# Patient Record
Sex: Male | Born: 1945 | Race: Black or African American | Hispanic: No | Smoking: Never smoker
Health system: Southern US, Community
[De-identification: ages and names within clinical notes are randomized; demographics above are authoritative.]

## PROBLEM LIST (undated history)

## (undated) DIAGNOSIS — M1990 Unspecified osteoarthritis, unspecified site: Secondary | ICD-10-CM

## (undated) DIAGNOSIS — I10 Essential (primary) hypertension: Secondary | ICD-10-CM

## (undated) HISTORY — PX: NASAL POLYP SURGERY: SHX186

## (undated) HISTORY — PX: ABDOMINAL SURGERY: SHX537

## (undated) HISTORY — PX: LEG SURGERY: SHX1003

---

## 1998-02-06 ENCOUNTER — Encounter: Payer: Self-pay | Admitting: Emergency Medicine

## 1998-02-06 ENCOUNTER — Emergency Department (HOSPITAL_COMMUNITY): Admission: EM | Admit: 1998-02-06 | Discharge: 1998-02-06 | Payer: Self-pay | Admitting: Emergency Medicine

## 2003-01-11 ENCOUNTER — Ambulatory Visit (HOSPITAL_COMMUNITY): Admission: RE | Admit: 2003-01-11 | Discharge: 2003-01-11 | Payer: Self-pay | Admitting: Family Medicine

## 2003-01-11 ENCOUNTER — Encounter: Payer: Self-pay | Admitting: Family Medicine

## 2003-11-14 ENCOUNTER — Ambulatory Visit (HOSPITAL_COMMUNITY): Admission: RE | Admit: 2003-11-14 | Discharge: 2003-11-14 | Payer: Self-pay | Admitting: Family Medicine

## 2005-07-25 ENCOUNTER — Ambulatory Visit (HOSPITAL_COMMUNITY): Admission: RE | Admit: 2005-07-25 | Discharge: 2005-07-25 | Payer: Self-pay | Admitting: Internal Medicine

## 2005-08-06 ENCOUNTER — Ambulatory Visit: Payer: Self-pay | Admitting: Orthopedic Surgery

## 2005-09-03 ENCOUNTER — Ambulatory Visit: Payer: Self-pay | Admitting: Orthopedic Surgery

## 2005-09-17 ENCOUNTER — Ambulatory Visit (HOSPITAL_COMMUNITY): Admission: RE | Admit: 2005-09-17 | Discharge: 2005-09-17 | Payer: Self-pay | Admitting: Urology

## 2005-11-04 ENCOUNTER — Ambulatory Visit: Payer: Self-pay | Admitting: Internal Medicine

## 2005-11-04 ENCOUNTER — Encounter (INDEPENDENT_AMBULATORY_CARE_PROVIDER_SITE_OTHER): Payer: Self-pay | Admitting: Specialist

## 2005-11-04 ENCOUNTER — Inpatient Hospital Stay (HOSPITAL_COMMUNITY): Admission: RE | Admit: 2005-11-04 | Discharge: 2005-11-11 | Payer: Self-pay | Admitting: Urology

## 2005-11-09 ENCOUNTER — Encounter: Payer: Self-pay | Admitting: Internal Medicine

## 2005-11-26 ENCOUNTER — Ambulatory Visit: Payer: Self-pay | Admitting: Cardiology

## 2006-01-21 ENCOUNTER — Ambulatory Visit: Payer: Self-pay | Admitting: Cardiology

## 2006-06-09 ENCOUNTER — Ambulatory Visit (HOSPITAL_COMMUNITY): Admission: RE | Admit: 2006-06-09 | Discharge: 2006-06-09 | Payer: Self-pay | Admitting: Urology

## 2008-11-10 ENCOUNTER — Emergency Department (HOSPITAL_COMMUNITY): Admission: EM | Admit: 2008-11-10 | Discharge: 2008-11-10 | Payer: Self-pay | Admitting: Emergency Medicine

## 2009-03-23 ENCOUNTER — Emergency Department (HOSPITAL_COMMUNITY): Admission: EM | Admit: 2009-03-23 | Discharge: 2009-03-23 | Payer: Self-pay | Admitting: Emergency Medicine

## 2010-07-23 ENCOUNTER — Emergency Department (HOSPITAL_COMMUNITY)
Admission: EM | Admit: 2010-07-23 | Discharge: 2010-07-23 | Disposition: A | Discharge status: Home / Self Care | Payer: BC Managed Care – PPO | Attending: Emergency Medicine | Admitting: Emergency Medicine

## 2010-07-23 ENCOUNTER — Emergency Department (HOSPITAL_COMMUNITY): Payer: BC Managed Care – PPO

## 2010-07-23 DIAGNOSIS — S82853A Displaced trimalleolar fracture of unspecified lower leg, initial encounter for closed fracture: Secondary | ICD-10-CM | POA: Insufficient documentation

## 2010-07-23 DIAGNOSIS — I10 Essential (primary) hypertension: Secondary | ICD-10-CM | POA: Insufficient documentation

## 2010-07-23 DIAGNOSIS — W1789XA Other fall from one level to another, initial encounter: Secondary | ICD-10-CM | POA: Insufficient documentation

## 2010-07-23 LAB — GLUCOSE, CAPILLARY: Glucose-Capillary: 86 mg/dL (ref 70–99)

## 2010-08-07 ENCOUNTER — Emergency Department (HOSPITAL_COMMUNITY)
Admission: EM | Admit: 2010-08-07 | Discharge: 2010-08-07 | Disposition: A | Discharge status: Home / Self Care | Payer: BC Managed Care – PPO | Attending: Emergency Medicine | Admitting: Emergency Medicine

## 2010-08-07 DIAGNOSIS — R066 Hiccough: Secondary | ICD-10-CM | POA: Insufficient documentation

## 2010-08-08 NOTE — Consult Note (Signed)
NAME:  Bobby Sosa, Bobby Sosa NO.:  [id number]   MEDICAL RECORD NO.:  [id number]          PATIENT TYPE:  INP   LOCATION:  0160                         FACILITY:  [facility]   PHYSICIAN:  Bobby Sosa, MDDATE OF BIRTH:  [birth year]   DATE OF CONSULTATION:  11/07/2005  DATE OF DISCHARGE:                                   CONSULTATION   REQUESTING PHYSICIAN:  Bobby Sosa, M.D.   PRIMARY CARE PHYSICIAN:  Bobby Sosa, M.D.   REASON FOR CONSULTATION:  Throat pain and possible unstable angina.   HISTORY OF PRESENT ILLNESS:  Bobby Sosa is very pleasant 65 year old male  from [place] with a history of hypertension.  He denies any history of  known coronary artery disease.  He does not have diabetes or  hypercholesterolemia.  He tells me he had a negative screening stress test  several years ago.  Recently he developed gross hematuria and found to have  a right renal mass.  He underwent laparoscopic resection on August 15.  Pathology is consistent with a grade 3 renal cell carcinoma.  Tonight he  developed throat pain with hiccups.  There was no relief with antacid.  Also  developed mild diaphoresis.  Rapid Response was activated.  EKG showed some  high lateral T-wave inversion which was slightly worse than baseline.  He  was given one sublingual nitroglycerin with prompt relief of his symptoms.  However, the pain returned.  He started on IV nitroglycerin and now he is  pain-free.  Initial CKs were 312 with MB of 3.5 and troponin of 0.04.  On  review of systems, at baseline he is fairly active with no exertional chest  pain.  He has not had congestive heart failure.  Postoperatively he has had  some constipation secondary to an ileus.  He denies any bright red blood per  rectum or melena.  He has not had any neurological symptoms.  Remainder of  review of history is negative for HPI and problem list.   PROBLEM LIST.:  1. Renal cell carcinoma of the right kidney,  status post laparoscopic      nephrectomy on August 15.  2. Hypertension.   CURRENT MEDICATIONS:  1. IV nitroglycerin.  2. Amlodipine 10 mg a day.  3. Hydrochlorothiazide 25 mg a day.  4. Dilaudid.   ALLERGIES:  No known drug allergies.   SOCIAL HISTORY:  He lives in [place].  He is a Programmer, systems.  He  is not married.  He denies any tobacco or alcohol use.  He is a [place]  veteran.   FAMILY HISTORY:  The patient's mother died at age 52 due to complications of  stomach cancer.  The patient's father died at age 78 due to complications  secondary to a stroke.  There is no family history of premature coronary  artery disease.   PHYSICAL EXAMINATION:  GENERAL:  He is mildly sleepy but wakes up and is  appropriately oriented and able to answer all questions.  VITAL SIGNS:  He is afebrile.  Blood pressure is 130/90 with a heart rate of  90.  He is saturating in the high 90s on 4 L.  HEENT:  Sclerae anicteric.  EOMI.  There is no xanthelasma.  Mucous  membranes are moist.  NECK:  Supple.  JVP is about 6-7 cmH2O.  Carotid are 2+ bilaterally without  bruits.  There is no lymphadenopathy or thyromegaly.  CARDIAC:  He has a regular rate and rhythm with no murmurs, rubs or gallops.  LUNGS:  Clear with very minimal bibasilar crackles.  He has poor expiratory  effort.  ABDOMEN:  Soft and quiet.  It is nontender.  There is no so significant  hepatosplenomegaly.  No bruits or masses.  There is a midline surgical scar,  which is stable with appears clean.  EXTREMITIES:  Warm with no cyanosis, clubbing or edema.  Distal pulses are  2+ bilaterally.  NEUROLOGIC:  He is alert and oriented x3.  Cranial nerves II-XII are intact.  Moves all four extremities without difficulty.  Affect is appropriate though  somewhat sleepy.   Labs show a CK of 212, MB of 3.5, troponin of 0.04.  Sodium 136, potassium  3.6, chloride 103, bicarb 28, glucose of 130, BUN  of 8, creatinine of 1.4.  CBC shows  a white count of 12.3, hemoglobin of 14.0 and platelets of 239.  EKGs:  Initial EKG this evening shows sinus tachycardia at a rate of 103  with LVH.  There is some T-wave inversion in I and aVL which is slightly  increased from baseline.  Follow-up EKG shows return to baseline with just  some mild T-wave flattening in the lateral leads.  Chest x-ray shows a poor  inspiratory effort with some vascular clotting.  No frank cardiopulmonary  process.   ASSESSMENT:  1. Throat pain responsive to nitroglycerin with mild EKG changes, probable      unstable angina.  2. Postoperative day #2 from right nephrectomy secondary to renal cell      carcinoma.  3. Hypertension.   PLAN:  1. Cycle cardiac markers.  2. Treat with aspirin, nitroglycerin and beta blocker.  3. Low-dose heparin without a bolus.  Will try to keep heparin level      between 0.35 and 0.45.  This was okayed by Bobby Sosa from a      surgical standpoint.  4. Check 2-D echocardiogram.  5. If enzymes are positive or echo is abnormal, he will need a      catheterization.  If enzymes are negative, we will need decide between      inpatient Myoview versus catheterization.   We will follow.  We appreciate the consult.      Bobby Buckles. Bensimhon, MD  Electronically Signed     DRB/MEDQ  D:  11/07/2005  T:  11/07/2005  Job:  [id number]   cc:   Bobby Sosa, M.D.  Fax: [phone]

## 2010-08-08 NOTE — Discharge Summary (Signed)
NAME:  Bobby Sosa, Bobby Sosa NO.:  [id number]   MEDICAL RECORD NO.:  [id number]          PATIENT TYPE:  OUT   LOCATION:  NUC                          FACILITY:  [facility]   PHYSICIAN:  Cornelious Bryant, MD     DATE OF BIRTH:  Nov 05, 1945   DATE OF ADMISSION:  11/09/2005  DATE OF DISCHARGE:  11/11/2005                                 DISCHARGE SUMMARY   ADMISSION DIAGNOSIS:  Right renal mass.   DISCHARGE DIAGNOSIS:  Right renal mass.   PROCEDURES:  Right laparoscopic radical nephrectomy.   IN-HOSPITAL SECONDARY DIAGNOSES:  1. Stable coronary artery disease.  2. Gastroesophageal reflux disease.   HISTORY OF PRESENT ILLNESS/HOSPITAL COURSE:  [name] is a 65 year old  African American gentleman who presented to Dr. Vevelyn Royals office for  evaluation of gross hematuria.  He underwent a full hematuria workup  including CT scan that revealed an enhancing right cystic renal mass with  extension into the renal pelvis.  Metastatic evaluation was negative,  including cystoscopy.  After excessive counseling, the patient elected for a  laparoscopic radical nephrectomy.  The patient was admitted on November 05, 2005, when he successfully underwent the procedure.  __________the patient  was observed on the floor.  On postop day #2, the patient complained of  symptoms that were consistent with acid reflux.  Maalox and Protonix were  given.  However, by postop day #3, the symptoms persisted.  The patient,  otherwise, was hemodynamically stable with no evidence of bleeding.  Cardiology consultation was stopped, and the patient was treated as a  potentially unstable angina in progress pending a thorough evaluation.  The  patient was placed on beta blockers, was started on a heparin drip.  He was  given aspirin, and his cardiac markers were cycled.  On postop day #5, the  patient underwent cardiac stress test that did reveal __________ischemia.  Thorough discussion between Cardiology Service  and the patient was  undertaken.  The patient elected to continue on medical therapy and deferred  cardiac intervention in terms of cardiac catheterization.  The patient,  otherwise, remained stable for the next three days and on postop 7, the  patient was deemed stable to go home.  At discharge, the patient was  afebrile.  His vital signs were stable.  He was in no acute distress.  He  was awake, alert and oriented.  He had clear breath sounds.  He was in  regular rate and rhythm.  His abdomen was soft and nontender.  His wounds  have healed.  The patient was voiding normally with no problems.  The  patient was then discharged home on Vicodin and Colace to be taken as  needed.  He was discharged also on Toprol XL as per Cardiology  recommendations.  The patient improved.  Follow up with Dr. Laverle Patter in the  next few weeks.  He is to follow up with his Cardiologist for further  cardiac monitoring and evaluation.  Should the patient have any chest pain,  abdominal pain, fevers, chills, wound drainage or dehiscence, he is to call  us immediately  or come to the emergency room.           ______________________________  Cornelious Bryant, MD    SK/MEDQ  D:  11/11/2005  T:  11/12/2005  Job:  [id number]

## 2010-08-08 NOTE — Assessment & Plan Note (Signed)
Bobby Sosa                              CARDIOLOGY OFFICE NOTE   Bobby Sosa, Bobby Sosa                      MRN:          [id number]  DATE:11/26/2005                            DOB:          Jun 07, 1945    REFERRING PHYSICIAN:  Corrie Sosa, M.D.   REASON FOR PRESENTATION:  Patient had an abnormal Cardiolite.   HISTORY OF PRESENT ILLNESS:  The patient is a pleasant 65 year old gentleman  whom I met at [facility].  He was admitted for a resection of a  kidney and was found to have renal cell carcinoma.  He did well though he  had some chest discomfort after his surgery.  This happened on the evening  of the second day.  It was a throat discomfort and upper chest discomfort.  There were questionable ST changes on his cardiogram that were nonspecific.  Subsequently ruled out for myocardial infarction.  He did have a stress  perfusion study which suggested a possible small area of ischemia.   I carefully talked to the patient afterward.  This was a low-risk Cardiolite  scan.  He was having absolutely no symptoms prior to this.  His symptoms  quickly resolved and were thought to be GI in nature in the hospital.  He  has been a quite active gentleman.  Given all of this, we decided to assume  that there was some coronary blockage but manage him medically as he has  stable symptoms (see the CHAMPION trial).   He now returns for followup.  He said he has been doing well.  He has not  been exercising though as he is waiting to see Dr. Laverle Patter and be cleared to  become more active.  He denies any chest pressure, neck discomfort, arm  discomfort, activity-induced nausea, vomiting, excessive diaphoresis,  palpitation, presyncope or syncope.  Denies any PND or orthopnea.   PAST MEDICAL HISTORY:  1. Renal cell carcinoma status post nephrectomy.  2. Hypertension.   ALLERGIES:  NONE.   MEDICATIONS:  1. Cozaar 100 mg daily.  2. Metoprolol 100  mg daily.  3. Aspirin 81 mg daily.   REVIEW OF SYSTEMS:  As stated in the HPI, otherwise negative for other  systems.   PHYSICAL EXAMINATION:  GENERAL:  The patient is in no distress.  VITAL SIGNS:  Blood pressure 148/85, heart rate 60 and regular, weight 216  pounds.  HEENT:  Eyes unremarkable.  Pupils equal, round, react to light.  Fundi not  visualized.  Oral mucosa unremarkable.  NECK:  No jugular venous distention, 45 degrees.  Carotid upstroke brisk and  symmetric.  No bruits.  No thyromegaly.  LYMPHATICS:  No cervical, axillary, inguinal adenopathy.  LUNGS:  Clear to auscultation. bilaterally.  BACK:  No costovertebral angle tenderness.  CHEST:  Unremarkable.  HEART:  PMI not displaced or sustained.  S1 and S2 within normal limits.  No  S3, no S4.  No murmurs.  ABDOMEN:  Obese.  Positive bowel sounds.  Normal  frequency and pitch.  No  bruits, rebound, guarding.  No midline pulsatile mass.  No organomegaly.  SKIN:  No rashes.  EXTREMITIES:  2+ pulses.  No edema.  NEURO:  Oriented to person, place, time.  Cranial nerves II through XII  grossly intact.  Motor grossly intact.   EKG:  Sinus rhythm, rate 60, actually within normal limits.  Intervals  within normal limits.  No acute ST-wave change.   ASSESSMENT/PLAN:  1. Abnormal stress test.  Patient did have a mildly abnormal stress test.      We are assuming that he might have some blockage but no symptoms at      this point.  Data would suggest we could manage this medically with      excellent outcomes.  He and I have been through this discussion at      great length.  At this point he will continue the medications as      listed.  He will continue with aggressive risk reduction.  2. Hypertension.  Blood pressure is slightly elevated.  I have asked him      to keep a blood pressure diary.  He can get it checked at school or      will buy a blood pressure cuff.  I would have a low threshold to up-      titrate his  medications, possibly with the addition of a low-dose      diuretic if his pressure is not under 140/90 consistently.  Also,      therapeutic lifestyle changes with weight loss will help this.  He can      follow this up with Dr. Phillips Odor going forward.  3. Risk reduction.  Patient should have a lipid profile done once he has      recovered from his surgery.  I think assuming that he has coronary      disease I would suggest that we pursue an LDL less than 100, an HDL      greater than 40.  His LDL was only 50 in the hospital but this may have      been falsely low.  4. Followup.  I would like to see him back in 6 months or sooner if      needed.                               Rollene Rotunda, MD, [facility]    JH/MedQ  DD:  11/26/2005  DT:  11/26/2005  Job #:  [id number]   cc:   Bobby Sosa, M.D.

## 2010-08-08 NOTE — H&P (Signed)
NAME:  Bobby Sosa, Bobby Sosa NO.:  [id number]   MEDICAL RECORD NO.:  [id number]          PATIENT TYPE:  INP   LOCATION:  NA                           FACILITY:  [facility]   PHYSICIAN:  Heloise Purpura, MD      DATE OF BIRTH:  September 07, 1945   DATE OF ADMISSION:  11/04/2005  DATE OF DISCHARGE:                                HISTORY & PHYSICAL   CHIEF COMPLAINT:  Right renal mass.   HISTORY:  Bobby Sosa is a 65 year old gentleman who was evaluated recently  due to a history of painless gross hematuria, right renal mass, and an  elevated PSA.  After undergoing a complete hematuria evaluation, he was  found to have a Bosniak IV cystic right renal mass concerning for possible  malignancy.  There was no other obvious source for the patient's hematuria.  In addition, the patient underwent a prostate biopsy for his elevated PSA  which did not demonstrate malignancy.  He therefore underwent a metastatic  evaluation for his possible renal malignancy, which was negative.  This  included both a chest x-ray and routine laboratory evaluation.  After  discussing the options for management of his renal mass, the patient elected  to proceed with a laparoscopic right radical nephrectomy.   PAST MEDICAL HISTORY:  Hypertension.   PAST SURGICAL HISTORY:  None.   MEDICATIONS:  The patient is on antihypertensive therapy.   ALLERGIES:  NO KNOWN DRUG ALLERGIES.   FAMILY HISTORY:  The patient's mother died at age 95 of stomach cancer.  The patient's father died at age 17 of complications secondary to a  cerebrovascular accident.  There is also a history of hypertension in the  family.  There is no history of prostate cancer, kidney cancer or other GU  malignancy.   SOCIAL HISTORY:  The patient is a Runner, broadcasting/film/video.  He denies tobacco or alcohol  use.  He is a [place] veteran.  He is currently not married.   PHYSICAL EXAMINATION:  CONSTITUTIONAL:  The patient is a well-nourished,  well-developed,  age-appropriate male in no acute distress.  HEENT: Normocephalic, atraumatic, oropharynx is clear.  NECK:  No JVD or carotid bruits.  LYMPH NODE SURVEY:  No supraclavicular, cervical or inguinal  lymphadenopathy.  CARDIOVASCULAR:  Regular rate and rhythm without obvious murmurs.  LUNGS:  Clear bilaterally.  ABDOMEN:  Mildly protuberant without abdominal masses or bruits.  His  abdomen is nontender and he has normoactive bowel sounds.  Back:  No CVA tenderness.  GU:  Normal uncircumcised male phallus without urethral discharge and penile  lesions.  Testes are descended bilaterally and are nontender and without  masses.  No inguinal hernias are present.  DIGITAL RECTAL EXAM:  Prostate is enlarged, measuring approximately 60 g,  but without nodularity or induration or tenderness.  EXTREMITIES:  No edema.  NEUROLOGIC:  Grossly intact.   RADIOLOGIC IMAGING:  The patient's CT scan demonstrates a 5.6-cm cystic  right renal mass with an enhancing, thickened nodular component consistent  with a possible cystic renal cell carcinoma.  There is no lymphadenopathy.  There appears to be a  single right renal artery and right renal vein.   IMPRESSION:  Right renal mass worrisome for possible malignancy.   PLAN:  Laparoscopic right radical nephrectomy.  The patient will then be  admitted to the hospital for routine postoperative care.           ______________________________  Heloise Purpura, MD  Electronically Signed     LB/MEDQ  D:  11/04/2005  T:  11/04/2005  Job:  [id number]

## 2010-08-08 NOTE — Assessment & Plan Note (Signed)
[facility]                              CARDIOLOGY OFFICE NOTE   Bobby Sosa, Bobby Sosa                      MRN:          [id number]  DATE:01/21/2006                            DOB:          [birth year]    The primary is Corrie Mckusick, M.D.   REASON FOR PRESENTATION:  Evaluate patient with hypertension.   HISTORY OF PRESENT ILLNESS:  The patient is a pleasant 65 year old  gentleman.  He was sent back by Dr. Lamar Blinks nurse for evaluation of his  hypertension.  He has been apparently participating in a study and has been  getting his blood pressure checked there.  He tells me that it has been in  the 135/85 range.  He has a blood pressure cuff at home now but has not yet  started using it.  Today it is elevated as described below.  He has been on  the medications as listed.  He says he is feeling well.  He has not gotten  to exercising.  He is not, however, with his routine daily living chores  having any chest discomfort, neck discomfort, arm discomfort, activity-  induced nausea or vomiting, or excessive diaphoresis.  He is not describing  any palpitations, presyncope or syncope.  He is having no PND or orthopnea.   PAST MEDICAL HISTORY:  1. Mildly abnormal stress perfusion study, being managed medically.  2. Renal cell cancer, status post nephrectomy.  3. Hypertension.   ALLERGIES:  None.   MEDICATIONS:  1. Cozaar 100 mg a day.  2. Metoprolol 100 mg a day.  3. Aspirin 81 mg daily.   REVIEW OF SYSTEMS:  As stated in the HPI and otherwise negative for other  systems.   PHYSICAL EXAMINATION:  GENERAL:  The patient is in no distress.  VITAL SIGNS:  Weight 222 pounds (up 6 pounds), body mass index 31, blood  pressure 152/97, heart rate 71 and regular.  HEENT:  Eyelids unremarkable.  Pupils equal, round, and reactive to light,  fundi not visualized.  Oral mucosa unremarkable.  NECK:  No jugular venous distention at 45 degrees.  Normal  carotid upstroke,  brisk and symmetric, no bruits, no thyromegaly.  LYMPHATIC:  No cervical, axillary or inguinal adenopathy.  LUNGS:  Clear to auscultation bilaterally.  BACK:  No costovertebral angle tenderness.  CHEST:  Unremarkable.  HEART:  PMI not displaced or sustained, S1, S2 within normal limits, no S3,  no S4, no murmurs.  ABDOMEN:  Obese, well-healed surgical scars, positive bowel sounds, normal  in frequency and pitch, no bruits, rebound, guarding, no midline pulsatile  mass, no hepatomegaly, no splenomegaly.  SKIN:  No rashes, no nodules.  EXTREMITIES:  2+ pulses, trace lower extremity edema, no cyanosis, no  clubbing.  NEUROLOGIC:  Oriented to person, place, time.  Cranial nerves II-XII are  grossly intact, motor grossly intact.   ASSESSMENT AND PLAN:  1. Hypertension.  The patient's blood pressure is not quite at target.  He      says his readings are 135/85.  I would not add another agent if that is  the case.  Rather, he should lose weight, and we discussed this at      length.  I calculated his ideal body weight for him.  This is actually      168 pounds, though I think 185 is reasonable.  He is going to keep a      blood pressure diary three times a week.  He is going to correlate his      blood pressure cuff with the nurses.  If indeed his blood pressure is      running higher, I will probably add a low-dose diuretic.  He has asked      me to follow his blood pressure.  2. Follow-up.  I will see him again in about 3 months.  However, he will      drop off his blood pressure diary results before then.    ______________________________  Rollene Rotunda, MD, [facility]    JH/MedQ  DD: 01/21/2006  DT: 01/22/2006  Job #: [id number]   cc:   Corrie Mckusick, M.D.

## 2010-08-08 NOTE — Discharge Summary (Signed)
NAME:  Bobby, Sosa NO.:  [id number]   MEDICAL RECORD NO.:  [id number]          PATIENT TYPE:  INP   LOCATION:  1418                         FACILITY:  [facility]   PHYSICIAN:  Heloise Purpura, MD      DATE OF BIRTH:  09-01-1945   DATE OF ADMISSION:  11/04/2005  DATE OF DISCHARGE:  11/11/2005                                 DISCHARGE SUMMARY   ADMISSION DIAGNOSIS:  Right renal mass.   DISCHARGE DIAGNOSES:  1. Renal cell carcinoma.  2. Coronary artery disease.   HISTORY AND PHYSICAL:  For full details, please see admission history and  physical.  Briefly, Mr. Bobby Sosa is a 65 year old gentleman who was found to  have an incidentally detected right renal mass with enhancement on CT scan  imaging worrisome for possible renal malignancy.  He underwent a metastatic  evaluation that was negative.  After counseling, he elected to proceed with  a laparoscopic right radical nephrectomy.   HOSPITAL COURSE:  On November 04, 2005, the patient was taken to the operating  room and underwent a right laparoscopic radical nephrectomy.  He tolerated  this procedure well and without complications.  Postoperatively, he was  transferred to a regular hospital room.  He was able to begin ambulating  that evening.  On the morning of postoperative day #1, he was begun on a  clear liquid diet.  By postoperative day #2, he was doing well but did have  some abdominal distension consistent with a possible mild postoperative  ileus.  On the evening of postoperative day #2, he began complaining of  significant throat pain and discomfort.  He was initially managed with  proton pump inhibitor therapy which did not relieve his pain.  Since his  pain persisted, he was evaluated with an EKG and was administered  nitroglycerin.  Nitroglycerin did relieve his pain and he did have some  changes on his EKG worrisome for possible myocardial ischemia.  He was  therefore transferred to the step-down unit  where he was placed on telemetry  and closely monitored.  A cardiology consultation was obtained.  By  postoperative day #3, he had return of bowel function and was able to be  advanced to a regular diet.  His wound looked good without evidence of  infection throughout the remainder of his hospital course.   He did undergo further cardiac evaluation.  He was maintained on a  nitroglycerin drip initially which was eventually able to be weaned off.  He  subsequently underwent a nuclear medicine stress test which did demonstrate  some inferior myocardial ischemia.  The patient was subsequently counseled  by the cardiology team regarding options, including medical management  versus cardiac catheterization with possible intervention.  The patient  elected to proceed with medical intervention and was placed on beta blocker  therapy.  By postoperative day #7, he was stable from a cardiac standpoint  and was subsequently able to be discharged home in good condition.   DISPOSITION:  Home.   DISCHARGE MEDICATIONS:  The patient was instructed to continue his Norvasc  and aspirin.  He was told to discontinue his Lotrel on hydrochlorothiazide.  In addition, he was given a prescription for Toprol XL 100 mg p.o. daily and  Cozaar 100 mg p.o. daily per cardiology's recommendations.  He was also  given a prescription for Vicodin take as needed for pain and told to take  Colace as a stool softener.   DISCHARGE INSTRUCTIONS:  The patient was instructed to be ambulatory but  specifically refrain from any heavy lifting or strenuous activity.  He was  told to call should he develop any chest pain, shortness of breath or other  symptoms consistent with wound infection or cardiac ischemia.  The patient  was instructed to refrain from any driving and told to resume a diet as  before surgery except for changes consistent with a cardiac prudent diet.   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  These results were discussed with the  patient.   FOLLOW UP:  Mr. Bobby Sosa will follow-up with me next week for staple removal.  He also has an appointment scheduled for November 26, 2005 with Dr. Antoine Poche  in cardiology.           ______________________________  Heloise Purpura, MD  Electronically Signed     LB/MEDQ  D:  11/11/2005  T:  11/12/2005  Job:  [id number]   cc:   Rollene Rotunda, MD, [facility]  [address]  [address]  [place]  [place] [address]   Corrie Mckusick, M.D.  Fax: [phone]

## 2010-08-08 NOTE — Op Note (Signed)
NAME:  Bobby Sosa, Bobby Sosa NO.:  [id number]   MEDICAL RECORD NO.:  [id number]          PATIENT TYPE:  INP   LOCATION:  1401                         FACILITY:  [facility]   PHYSICIAN:  Heloise Purpura, MD      DATE OF BIRTH:  08/07/1945   DATE OF PROCEDURE:  11/04/2005  DATE OF DISCHARGE:                                 OPERATIVE REPORT   PREOPERATIVE DIAGNOSIS:  Right renal mass.   POSTOPERATIVE DIAGNOSIS:  Right renal mass.   PROCEDURE:  Right laparoscopic radical nephrectomy.   SURGEON:  Heloise Purpura, M.D.   ASSISTANT:  Cornelious Bryant, M.D.   ANESTHESIA:  General.   COMPLICATIONS:  None.   ESTIMATED BLOOD LOSS:  200 mL.   INTRAVENOUS FLUIDS:  2800 mL of lactated Ringer's.   SPECIMEN:  The right kidney and proximal ureter.   DRAINS:  18-French Foley catheter.   INDICATIONS:  Bobby Sosa is a 65 year old gentleman with a history of gross  hematuria.  He underwent a full hematuria evaluation and was found on CT  scan to have an enhancing right cystic renal mass measuring 5.6 cm with  extension toward the renal pelvis.  She underwent a metastatic evaluation  including chest imaging and laboratory evaluation which were negative.  The  remainder of his hematuria evaluation including cystoscopy was negative.  He  underwent a metastatic evaluation including chest imaging and a laboratory  evaluation which was negative.  Based on the size of his renal mass and the  location, a discussion was held regarding management options.  It was felt  that the patient would be best served by a radical nephrectomy.  After  discussing management options, the patient elected to proceed with this  procedure via a laparoscopic approach.  The potential risks and benefits  were discussed with the patient and he consented.   DESCRIPTION OF PROCEDURE:  The patient was taken to the operating room and a  general anesthetic was administered.  He was given preoperative antibiotics,  placed  in the right modified flank position, prepped and draped in the usual  sterile fashion.  A preoperative time out was performed.  A site was then  selected in the midline just superior to the umbilicus for placement of the  Hasson cannula.  A 1 cm vertical incision was made in the skin and carried  down through the subcutaneous tissues with electrocautery.  The rectus  fascia was then identified and lightly scored on the anterior surface.  The  peritoneal cavity was then entered under direct vision.  0 Vicryl holding  stitches were then placed.  The Hasson cannula was then inserted and secured  with the fascial holding stitches.  A pneumoperitoneum was established and  the 30 degrees lens was used to inspect the abdomen.  There was no evidence  of any intra-abdominal injuries or other abnormalities.  The remaining ports  were then placed.  A 12 mm port was placed just lateral to the rectus muscle  on the right side just inferior to the camera port site.  An additional 5 mm  port was  placed between the umbilicus and xiphoid process just to the right  of the falciform ligament.  An additional 5 mm port was placed in the far  lateral abdominal wall.  All ports were placed under direct vision without  difficulty.   With the harmonic scalpel, the white line of Toldt was incised allowing the  colon to be mobilized medially and the space between the colonic mesentery  and Gerota's fascia to be developed.  The ureter and gonadal vein were  identified and lifted anteriorly off the surface of the psoas muscle,  thereby defining the posterior plane of the kidney.  The gonadal vein was  then divided between multiple hemoclips.  Dissection continued superiorly  until the renal hilum was encountered.  There was noted to be a single renal  vein as well as a single renal artery that was just posterior to the renal  vein.  The renal artery was able to be isolated, ligated with multiple Hem-A-  Lock clips  and divided.  The renal vein was similarly isolated with the aid  of right angle dissection and then stapled and divided with the 45 mm flex  ETS stapler with a vascular staple load.  This resulted in excellent  hemostasis at the hilum.  Dissection continued superiorly.  Gerota's fascia  was entered.  Due to the lower pole location of the renal mass, it was  decided to spare the adrenal gland.  The superior aspect of the kidney was  dissected free from the adrenal gland with the aid of the harmonic scalpel.  Dissection then continued superiorly, freeing the peritoneal attachments to  the liver as well as the lateral attachments of the kidney.  The ureter was  then identified off the lower pole of the kidney and was divided between Hem-  A-Lock clips.  The remaining perinephric tissue was then incised with the  harmonic scalpel allowing the kidney specimen to be completely mobilized.   At this point, there was noted to be some light bleeding off the anterior  aspect of the inferior vena cava.  This was carefully examined and appeared  to be coming from vasovasorum just off the gonadal vein stump.  Due to the  risk of placing a clip and causing significant bleeding, it was decided to  place FloSeal and Surgicel over this area.  This resulted in excellent  hemostasis.  The renal fossa was then copiously irrigated and the  pneumoperitoneum was evacuated.  There continued to be excellent hemostasis  over the vena cava, around the renal hilum, and in the renal fossa.  The  Hasson cannula was then removed and the 15 mm EndoCatch II retrieval bag was  placed through this port site and used to retrieve the right kidney  specimen.  The periumbilical incision was then extended from the original  camera port site and the specimen was removed intact within the EndoCatch II  bag.  This fascial opening was then closed with two running 0 Vicryl sutures.  A pneumoperitoneum was then re-established and the  30 degrees lens  was used to inspect the abdomen.  Again, there appeared to be excellent  hemostasis.  A 0 Vicryl stitch had been placed through the right lateral  12mm port site with the aid of the suture passer prior to removal of the  kidney.  All remaining ports were then removed under direct vision and the  pneumoperitoneum was evacuated.  The colon had been placed back into its  normal anatomic  position.  The previously placed 0 Vicryl stitch was then  used to close the 12 mm port site.  All port sites were then injected with  0.25% Marcaine and reapproximated at the skin level with staples.  Sterile  dressings were applied.  The patient appeared to tolerate the procedure well  and without complications.  He was able to be extubated and transferred to  the recovery unit in satisfactory condition.           ______________________________  Heloise Purpura, MD  Electronically Signed     LB/MEDQ  D:  11/04/2005  T:  11/04/2005  Job:  [id number]

## 2010-08-11 ENCOUNTER — Emergency Department (HOSPITAL_COMMUNITY): Payer: BC Managed Care – PPO

## 2010-08-11 ENCOUNTER — Emergency Department (HOSPITAL_COMMUNITY)
Admission: EM | Admit: 2010-08-11 | Discharge: 2010-08-11 | Disposition: A | Discharge status: Home / Self Care | Payer: BC Managed Care – PPO | Attending: Emergency Medicine | Admitting: Emergency Medicine

## 2010-08-11 DIAGNOSIS — I10 Essential (primary) hypertension: Secondary | ICD-10-CM | POA: Insufficient documentation

## 2010-08-11 DIAGNOSIS — Z7982 Long term (current) use of aspirin: Secondary | ICD-10-CM | POA: Insufficient documentation

## 2010-08-11 DIAGNOSIS — R079 Chest pain, unspecified: Secondary | ICD-10-CM | POA: Insufficient documentation

## 2010-08-11 DIAGNOSIS — Z79899 Other long term (current) drug therapy: Secondary | ICD-10-CM | POA: Insufficient documentation

## 2010-08-11 LAB — POCT CARDIAC MARKERS: Myoglobin, poc: 217 ng/mL (ref 12–200)

## 2010-08-11 LAB — CBC
HCT: 38.2 % — ABNORMAL LOW (ref 39.0–52.0)
Hemoglobin: 13 g/dL (ref 13.0–17.0)
MCH: 30.5 pg (ref 26.0–34.0)
MCHC: 34 g/dL (ref 30.0–36.0)
MCV: 89.7 fL (ref 78.0–100.0)
Platelets: 366 10*3/uL (ref 150–400)
RDW: 13.6 % (ref 11.5–15.5)
WBC: 7.8 10*3/uL (ref 4.0–10.5)

## 2010-08-11 LAB — COMPREHENSIVE METABOLIC PANEL
ALT: 28 U/L (ref 0–53)
BUN: 13 mg/dL (ref 6–23)
CO2: 25 mEq/L (ref 19–32)
Calcium: 10 mg/dL (ref 8.4–10.5)
Creatinine, Ser: 1.04 mg/dL (ref 0.4–1.5)
Sodium: 131 mEq/L — ABNORMAL LOW (ref 135–145)

## 2010-08-11 LAB — LIPASE, BLOOD: Lipase: 20 U/L (ref 11–59)

## 2010-08-11 LAB — DIFFERENTIAL
Basophils Absolute: 0.1 10*3/uL (ref 0.0–0.1)
Eosinophils Absolute: 0.3 10*3/uL (ref 0.0–0.7)
Lymphs Abs: 1.4 10*3/uL (ref 0.7–4.0)
Monocytes Absolute: 0.8 10*3/uL (ref 0.1–1.0)
Neutrophils Relative %: 68 % (ref 43–77)

## 2010-09-25 ENCOUNTER — Ambulatory Visit (INDEPENDENT_AMBULATORY_CARE_PROVIDER_SITE_OTHER): Payer: BC Managed Care – PPO | Admitting: Internal Medicine

## 2010-11-10 ENCOUNTER — Other Ambulatory Visit (HOSPITAL_COMMUNITY): Payer: Self-pay | Admitting: Urology

## 2010-11-10 ENCOUNTER — Ambulatory Visit (HOSPITAL_COMMUNITY)
Admission: RE | Admit: 2010-11-10 | Discharge: 2010-11-10 | Disposition: A | Discharge status: Home / Self Care | Payer: BC Managed Care – PPO | Source: Ambulatory Visit | Attending: Urology | Admitting: Urology

## 2010-11-10 DIAGNOSIS — I517 Cardiomegaly: Secondary | ICD-10-CM | POA: Insufficient documentation

## 2010-11-10 DIAGNOSIS — Z85528 Personal history of other malignant neoplasm of kidney: Secondary | ICD-10-CM | POA: Insufficient documentation

## 2011-12-17 DIAGNOSIS — R972 Elevated prostate specific antigen [PSA]: Secondary | ICD-10-CM | POA: Diagnosis not present

## 2011-12-17 DIAGNOSIS — C649 Malignant neoplasm of unspecified kidney, except renal pelvis: Secondary | ICD-10-CM | POA: Diagnosis not present

## 2011-12-17 DIAGNOSIS — N423 Unspecified dysplasia of prostate: Secondary | ICD-10-CM | POA: Diagnosis not present

## 2012-01-04 DIAGNOSIS — J029 Acute pharyngitis, unspecified: Secondary | ICD-10-CM | POA: Diagnosis not present

## 2012-01-04 DIAGNOSIS — R070 Pain in throat: Secondary | ICD-10-CM | POA: Diagnosis not present

## 2012-04-23 DEATH — deceased

## 2012-08-13 DIAGNOSIS — I10 Essential (primary) hypertension: Secondary | ICD-10-CM | POA: Diagnosis not present

## 2012-08-13 DIAGNOSIS — Z6831 Body mass index (BMI) 31.0-31.9, adult: Secondary | ICD-10-CM | POA: Diagnosis not present

## 2012-08-13 DIAGNOSIS — L0390 Cellulitis, unspecified: Secondary | ICD-10-CM | POA: Diagnosis not present

## 2012-08-13 DIAGNOSIS — L0291 Cutaneous abscess, unspecified: Secondary | ICD-10-CM | POA: Diagnosis not present

## 2012-08-13 DIAGNOSIS — C649 Malignant neoplasm of unspecified kidney, except renal pelvis: Secondary | ICD-10-CM | POA: Diagnosis not present

## 2012-09-30 DIAGNOSIS — Z713 Dietary counseling and surveillance: Secondary | ICD-10-CM | POA: Diagnosis not present

## 2012-09-30 DIAGNOSIS — I10 Essential (primary) hypertension: Secondary | ICD-10-CM | POA: Diagnosis not present

## 2012-09-30 DIAGNOSIS — Z6831 Body mass index (BMI) 31.0-31.9, adult: Secondary | ICD-10-CM | POA: Diagnosis not present

## 2012-09-30 DIAGNOSIS — J019 Acute sinusitis, unspecified: Secondary | ICD-10-CM | POA: Diagnosis not present

## 2012-09-30 DIAGNOSIS — Z7182 Exercise counseling: Secondary | ICD-10-CM | POA: Diagnosis not present

## 2012-10-17 IMAGING — CR DG PELVIS 1-2V
1 series · 1 of 1 positions shown · non-contrast
Comparison: None.

CLINICAL DATA: Fall.

PELVIS - 1-2 VIEW

[view not recorded]
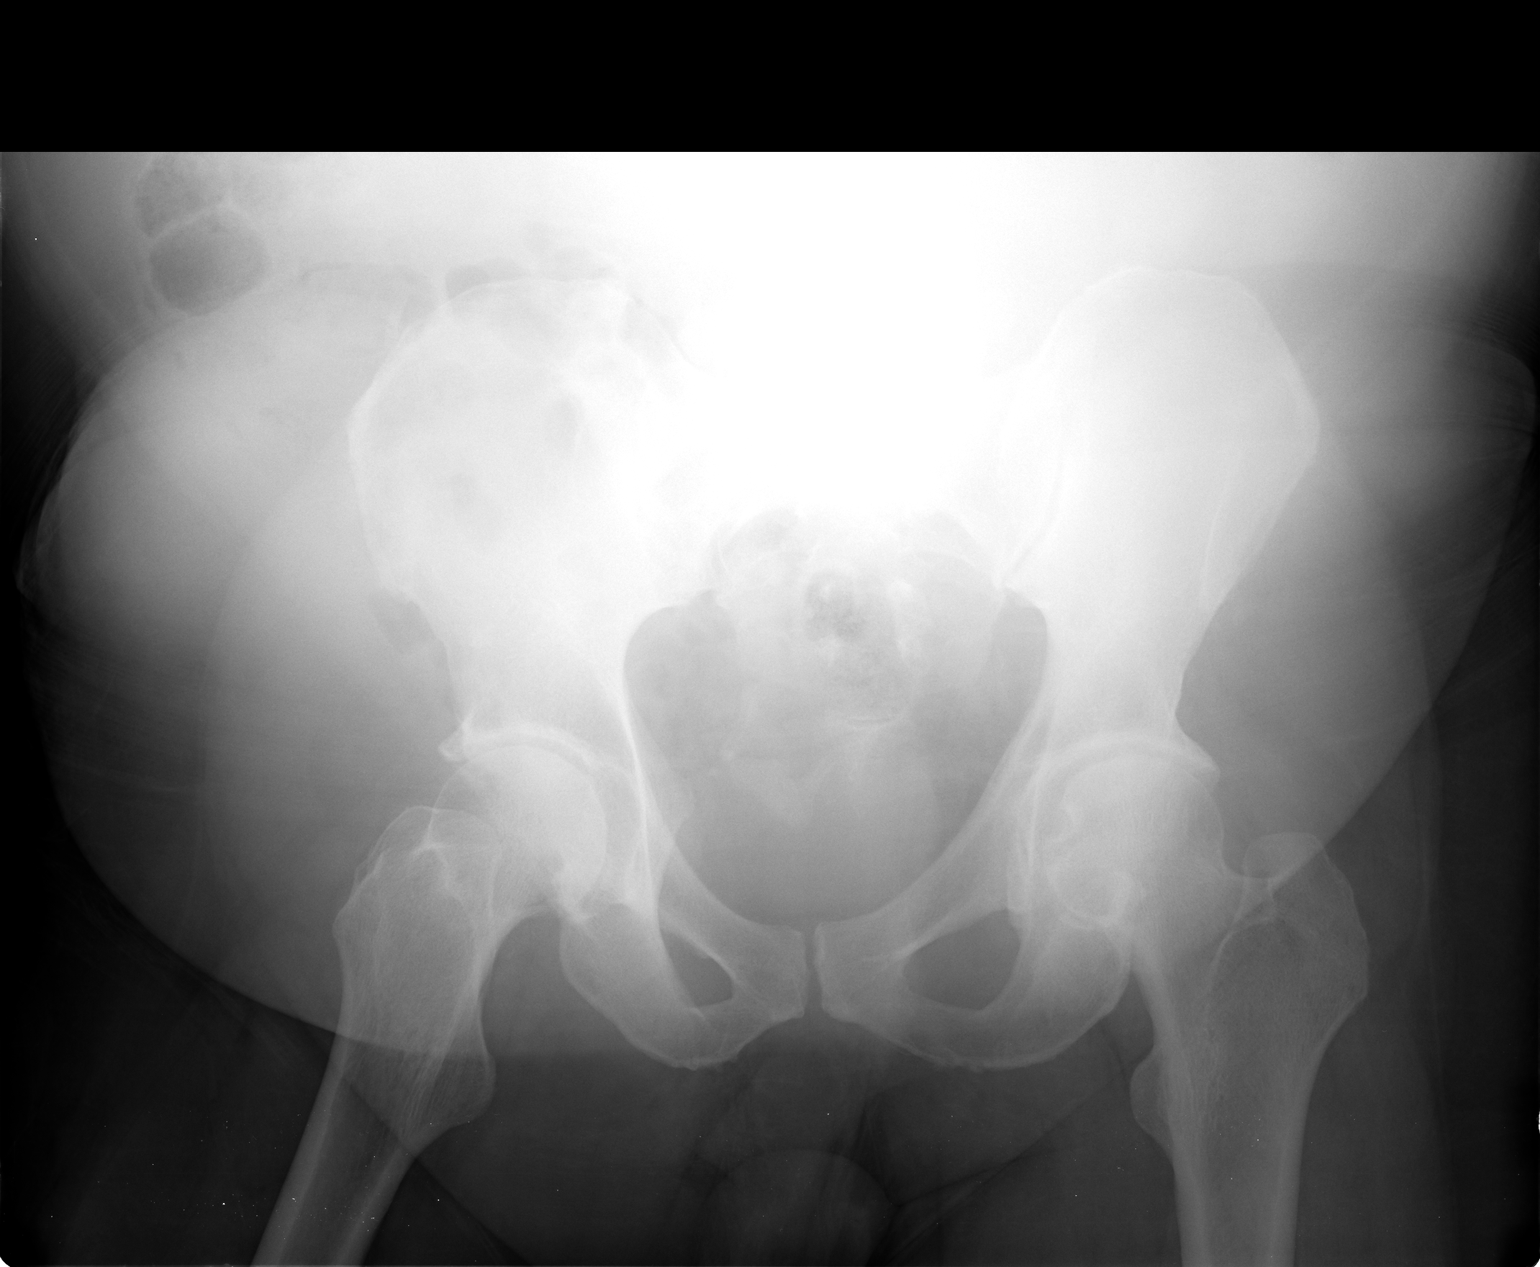

[1 of 1 positions shown; findings below may reference images not displayed]

FINDINGS: Detailed evaluation of the upper bony pelvis is somewhat
limited due to body habitus of the patient.  No evidence of
fracture.  Mild osteoarthritis of both hip joints.  Soft tissues
are unremarkable.
IMPRESSION: No evidence of pelvic fracture.

## 2012-11-11 ENCOUNTER — Encounter (HOSPITAL_COMMUNITY): Payer: Self-pay | Admitting: Emergency Medicine

## 2012-11-11 ENCOUNTER — Emergency Department (HOSPITAL_COMMUNITY): Payer: Medicare Other

## 2012-11-11 ENCOUNTER — Emergency Department (HOSPITAL_COMMUNITY)
Admission: EM | Admit: 2012-11-11 | Discharge: 2012-11-11 | Disposition: A | Discharge status: Home / Self Care | Payer: Medicare Other | Attending: Emergency Medicine | Admitting: Emergency Medicine

## 2012-11-11 DIAGNOSIS — I10 Essential (primary) hypertension: Secondary | ICD-10-CM | POA: Diagnosis not present

## 2012-11-11 DIAGNOSIS — M545 Low back pain: Secondary | ICD-10-CM

## 2012-11-11 DIAGNOSIS — IMO0002 Reserved for concepts with insufficient information to code with codable children: Secondary | ICD-10-CM | POA: Insufficient documentation

## 2012-11-11 DIAGNOSIS — Z8739 Personal history of other diseases of the musculoskeletal system and connective tissue: Secondary | ICD-10-CM | POA: Insufficient documentation

## 2012-11-11 DIAGNOSIS — S8990XA Unspecified injury of unspecified lower leg, initial encounter: Secondary | ICD-10-CM | POA: Insufficient documentation

## 2012-11-11 DIAGNOSIS — Y9389 Activity, other specified: Secondary | ICD-10-CM | POA: Insufficient documentation

## 2012-11-11 DIAGNOSIS — Y92410 Unspecified street and highway as the place of occurrence of the external cause: Secondary | ICD-10-CM | POA: Insufficient documentation

## 2012-11-11 HISTORY — DX: Unspecified osteoarthritis, unspecified site: M19.90

## 2012-11-11 HISTORY — DX: Essential (primary) hypertension: I10
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
## 2012-11-12 NOTE — ED Provider Notes (Signed)
Medical screening examination/treatment/procedure(s) were performed by non-physician practitioner and as supervising physician I was immediately available for consultation/collaboration.   Shanna Cisco, MD 11/12/12 1050

## 2012-12-09 ENCOUNTER — Other Ambulatory Visit (HOSPITAL_COMMUNITY): Payer: Self-pay | Admitting: Family Medicine

## 2012-12-09 DIAGNOSIS — Z6830 Body mass index (BMI) 30.0-30.9, adult: Secondary | ICD-10-CM | POA: Diagnosis not present

## 2012-12-09 DIAGNOSIS — R599 Enlarged lymph nodes, unspecified: Secondary | ICD-10-CM | POA: Diagnosis not present

## 2012-12-09 DIAGNOSIS — R220 Localized swelling, mass and lump, head: Secondary | ICD-10-CM | POA: Diagnosis not present

## 2012-12-09 DIAGNOSIS — Z79899 Other long term (current) drug therapy: Secondary | ICD-10-CM | POA: Diagnosis not present

## 2012-12-13 ENCOUNTER — Ambulatory Visit (HOSPITAL_COMMUNITY)
Admission: RE | Admit: 2012-12-13 | Discharge: 2012-12-13 | Disposition: A | Discharge status: Home / Self Care | Payer: Medicare Other | Source: Ambulatory Visit | Attending: Family Medicine | Admitting: Family Medicine

## 2012-12-13 DIAGNOSIS — R220 Localized swelling, mass and lump, head: Secondary | ICD-10-CM | POA: Insufficient documentation

## 2012-12-13 DIAGNOSIS — R599 Enlarged lymph nodes, unspecified: Secondary | ICD-10-CM | POA: Diagnosis not present

## 2013-02-07 DIAGNOSIS — M159 Polyosteoarthritis, unspecified: Secondary | ICD-10-CM | POA: Diagnosis not present

## 2013-02-07 DIAGNOSIS — M533 Sacrococcygeal disorders, not elsewhere classified: Secondary | ICD-10-CM | POA: Diagnosis not present

## 2013-02-07 DIAGNOSIS — Z23 Encounter for immunization: Secondary | ICD-10-CM | POA: Diagnosis not present

## 2013-02-07 DIAGNOSIS — I10 Essential (primary) hypertension: Secondary | ICD-10-CM | POA: Diagnosis not present

## 2013-02-07 DIAGNOSIS — Z6832 Body mass index (BMI) 32.0-32.9, adult: Secondary | ICD-10-CM | POA: Diagnosis not present

## 2013-10-07 DIAGNOSIS — M1990 Unspecified osteoarthritis, unspecified site: Secondary | ICD-10-CM | POA: Diagnosis not present

## 2013-10-07 DIAGNOSIS — I10 Essential (primary) hypertension: Secondary | ICD-10-CM | POA: Diagnosis not present

## 2013-10-07 DIAGNOSIS — Z6830 Body mass index (BMI) 30.0-30.9, adult: Secondary | ICD-10-CM | POA: Diagnosis not present

## 2013-10-09 DIAGNOSIS — Z6829 Body mass index (BMI) 29.0-29.9, adult: Secondary | ICD-10-CM | POA: Diagnosis not present

## 2013-10-09 DIAGNOSIS — M5430 Sciatica, unspecified side: Secondary | ICD-10-CM | POA: Diagnosis not present

## 2014-05-11 ENCOUNTER — Encounter (HOSPITAL_COMMUNITY): Payer: Self-pay | Admitting: *Deleted

## 2014-05-11 ENCOUNTER — Emergency Department (HOSPITAL_COMMUNITY): Payer: Medicare Other

## 2014-05-11 ENCOUNTER — Inpatient Hospital Stay (HOSPITAL_COMMUNITY)
Admission: EM | Admit: 2014-05-11 | Discharge: 2014-05-14 | DRG: 313 | Disposition: A | Discharge status: Home / Self Care | Payer: Medicare Other | Attending: Internal Medicine | Admitting: Internal Medicine

## 2014-05-11 DIAGNOSIS — Z905 Acquired absence of kidney: Secondary | ICD-10-CM | POA: Diagnosis not present

## 2014-05-11 DIAGNOSIS — E876 Hypokalemia: Secondary | ICD-10-CM | POA: Insufficient documentation

## 2014-05-11 DIAGNOSIS — Z85528 Personal history of other malignant neoplasm of kidney: Secondary | ICD-10-CM

## 2014-05-11 DIAGNOSIS — M1990 Unspecified osteoarthritis, unspecified site: Secondary | ICD-10-CM | POA: Diagnosis present

## 2014-05-11 DIAGNOSIS — N400 Enlarged prostate without lower urinary tract symptoms: Secondary | ICD-10-CM | POA: Diagnosis not present

## 2014-05-11 DIAGNOSIS — Z7982 Long term (current) use of aspirin: Secondary | ICD-10-CM

## 2014-05-11 DIAGNOSIS — I10 Essential (primary) hypertension: Secondary | ICD-10-CM | POA: Diagnosis not present

## 2014-05-11 DIAGNOSIS — R0602 Shortness of breath: Secondary | ICD-10-CM | POA: Diagnosis not present

## 2014-05-11 DIAGNOSIS — R079 Chest pain, unspecified: Secondary | ICD-10-CM | POA: Diagnosis not present

## 2014-05-11 DIAGNOSIS — Z09 Encounter for follow-up examination after completed treatment for conditions other than malignant neoplasm: Secondary | ICD-10-CM

## 2014-05-11 DIAGNOSIS — J449 Chronic obstructive pulmonary disease, unspecified: Secondary | ICD-10-CM | POA: Diagnosis present

## 2014-05-11 DIAGNOSIS — Z79899 Other long term (current) drug therapy: Secondary | ICD-10-CM

## 2014-05-11 DIAGNOSIS — D72829 Elevated white blood cell count, unspecified: Secondary | ICD-10-CM | POA: Diagnosis present

## 2014-05-11 DIAGNOSIS — E871 Hypo-osmolality and hyponatremia: Secondary | ICD-10-CM

## 2014-05-11 DIAGNOSIS — R071 Chest pain on breathing: Secondary | ICD-10-CM | POA: Diagnosis not present

## 2014-05-11 DIAGNOSIS — N390 Urinary tract infection, site not specified: Secondary | ICD-10-CM | POA: Diagnosis present

## 2014-05-11 DIAGNOSIS — R7989 Other specified abnormal findings of blood chemistry: Secondary | ICD-10-CM

## 2014-05-11 DIAGNOSIS — R066 Hiccough: Secondary | ICD-10-CM

## 2014-05-11 DIAGNOSIS — R0789 Other chest pain: Secondary | ICD-10-CM | POA: Diagnosis present

## 2014-05-11 DIAGNOSIS — K760 Fatty (change of) liver, not elsewhere classified: Secondary | ICD-10-CM | POA: Diagnosis not present

## 2014-05-11 DIAGNOSIS — J9811 Atelectasis: Secondary | ICD-10-CM | POA: Diagnosis not present

## 2014-05-11 DIAGNOSIS — M12812 Other specific arthropathies, not elsewhere classified, left shoulder: Secondary | ICD-10-CM | POA: Diagnosis not present

## 2014-05-11 DIAGNOSIS — R109 Unspecified abdominal pain: Secondary | ICD-10-CM

## 2014-05-11 LAB — TROPONIN I: Troponin I: 0.04 ng/mL — ABNORMAL HIGH (ref <0.031)

## 2014-05-11 LAB — COMPREHENSIVE METABOLIC PANEL
ALBUMIN: 3.4 g/dL — AB (ref 3.5–5.2)
ALT: 15 U/L (ref 0–53)
AST: 22 U/L (ref 0–37)
Alkaline Phosphatase: 62 U/L (ref 39–117)
Anion gap: 10 (ref 5–15)
BUN: 18 mg/dL (ref 6–23)
CALCIUM: 8.3 mg/dL — AB (ref 8.4–10.5)
CO2: 21 mmol/L (ref 19–32)
Chloride: 90 mmol/L — ABNORMAL LOW (ref 96–112)
Creatinine, Ser: 1.29 mg/dL (ref 0.50–1.35)
GFR calc non Af Amer: 55 mL/min — ABNORMAL LOW (ref >90)
GFR, EST AFRICAN AMERICAN: 64 mL/min — AB (ref >90)
Glucose, Bld: 112 mg/dL — ABNORMAL HIGH (ref 70–99)
Potassium: 3.4 mmol/L — ABNORMAL LOW (ref 3.5–5.1)
SODIUM: 121 mmol/L — AB (ref 135–145)
TOTAL PROTEIN: 6.9 g/dL (ref 6.0–8.3)
Total Bilirubin: 1.2 mg/dL (ref 0.3–1.2)

## 2014-05-11 LAB — URINALYSIS, ROUTINE W REFLEX MICROSCOPIC
Bilirubin Urine: NEGATIVE
GLUCOSE, UA: NEGATIVE mg/dL
Ketones, ur: NEGATIVE mg/dL
Nitrite: NEGATIVE
PH: 6 (ref 5.0–8.0)
PROTEIN: NEGATIVE mg/dL
Urobilinogen, UA: 0.2 mg/dL (ref 0.0–1.0)

## 2014-05-11 LAB — CBC WITH DIFFERENTIAL/PLATELET
Basophils Absolute: 0 10*3/uL (ref 0.0–0.1)
Basophils Relative: 0 % (ref 0–1)
EOS ABS: 0 10*3/uL (ref 0.0–0.7)
EOS PCT: 0 % (ref 0–5)
HEMATOCRIT: 40.4 % (ref 39.0–52.0)
Hemoglobin: 14.7 g/dL (ref 13.0–17.0)
LYMPHS ABS: 0.7 10*3/uL (ref 0.7–4.0)
Lymphocytes Relative: 3 % — ABNORMAL LOW (ref 12–46)
MCH: 32 pg (ref 26.0–34.0)
MCHC: 36.4 g/dL — AB (ref 30.0–36.0)
MCV: 88 fL (ref 78.0–100.0)
MONO ABS: 1.5 10*3/uL — AB (ref 0.1–1.0)
MONOS PCT: 6 % (ref 3–12)
NEUTROS ABS: 23.5 10*3/uL — AB (ref 1.7–7.7)
NEUTROS PCT: 92 % — AB (ref 43–77)
Platelets: 135 10*3/uL — ABNORMAL LOW (ref 150–400)
RBC: 4.59 MIL/uL (ref 4.22–5.81)
RDW: 13.1 % (ref 11.5–15.5)
WBC: 25.6 10*3/uL — ABNORMAL HIGH (ref 4.0–10.5)

## 2014-05-11 LAB — URINE MICROSCOPIC-ADD ON

## 2014-05-11 LAB — I-STAT CG4 LACTIC ACID, ED: LACTIC ACID, VENOUS: 1.45 mmol/L (ref 0.5–2.0)

## 2014-05-11 LAB — LIPASE, BLOOD: LIPASE: 20 U/L (ref 11–59)

## 2014-05-11 MED ORDER — SODIUM CHLORIDE 0.9 % IJ SOLN
INTRAMUSCULAR | Status: AC
  Administered 2014-05-11: 22:00:00
  Filled 2014-05-11: qty 250

## 2014-05-11 MED ORDER — SODIUM CHLORIDE 0.9 % IV SOLN
Freq: Once | INTRAVENOUS | Status: AC
  Administered 2014-05-11: 19:00:00 via INTRAVENOUS

## 2014-05-11 MED ORDER — IOHEXOL 300 MG/ML  SOLN
100.0000 mL | Freq: Once | INTRAMUSCULAR | Status: AC | PRN
Start: 2014-05-11 — End: 2014-05-11
  Administered 2014-05-11: 100 mL via INTRAVENOUS

## 2014-05-11 MED ORDER — IOHEXOL 300 MG/ML  SOLN
50.0000 mL | Freq: Once | INTRAMUSCULAR | Status: AC | PRN
  Administered 2014-05-11: 50 mL via ORAL

## 2014-05-11 NOTE — ED Notes (Signed)
Notifed EDP of pt small loose stools and lab results.

## 2014-05-11 NOTE — ED Provider Notes (Addendum)
CSN: [id number]     Arrival date & time 05/11/14  1823 History   First MD Initiated Contact with Patient 05/11/14 1836     Chief Complaint  Patient presents with  . Chest Pain     (Consider location/radiation/quality/duration/timing/severity/associated sxs/prior Treatment) HPI Comments: Patient presents to the ER for evaluation of chest pain, shortness of breath and hiccups. Patient reports that symptoms began earlier today. He has been having hiccups on and off all day. Reports the pain in his upper chest that worsens when the hiccup occurs. He has been having trouble breathing because of the hiccups well. He has noticed some abdominal discomfort. No vomiting, diarrhea, constipation.  Patient thought he had "indigestion" yesterday. He went to the [place] and was reportedly diagnosed with a hemorrhoid. He was given a suppository for the hemorrhoid and he thinks using the suppository has caused his symptoms today.  Patient is a 69 y.o. male presenting with chest pain.  Chest Pain Associated symptoms: shortness of breath     Past Medical History  Diagnosis Date  . Arthritis   . Hypertension    Past Surgical History  Procedure Laterality Date  . Leg surgery    . Abdominal surgery    . Nasal polyp surgery     Family History  Problem Relation Age of Onset  . Diabetes Mother   . Diabetes Father    History  Substance Use Topics  . Smoking status: Never Smoker   . Smokeless tobacco: Not on file  . Alcohol Use: No    Review of Systems  Respiratory: Positive for shortness of breath.   Cardiovascular: Positive for chest pain.  Gastrointestinal: Positive for abdominal distention.  All other systems reviewed and are negative.     Allergies  Review of patient's allergies indicates no known allergies.  Home Medications   Prior to Admission medications   Medication Sig Start Date End Date Taking? Authorizing Provider  amLODipine (NORVASC) 10 MG tablet Take 10 mg by mouth daily.    Yes Historical Provider, MD  ANUCORT-HC 25 MG suppository Place 25 mg rectally every 6 (six) hours as needed. 05/10/14  Yes Historical Provider, MD  aspirin EC 81 MG tablet Take 81 mg by mouth daily.   Yes Historical Provider, MD  lisinopril (PRINIVIL,ZESTRIL) 40 MG tablet Take 40 mg by mouth daily. 05/10/14  Yes Historical Provider, MD  omeprazole (PRILOSEC) 20 MG capsule Take 20 mg by mouth daily.   Yes Historical Provider, MD   BP 153/71 mmHg  Pulse 96  Temp(Src) 98.1 F (36.7 C) (Oral)  Resp 18  Ht 5\' 11"  (1.803 m)  Wt 220 lb (99.791 kg)  BMI 30.70 kg/m2  SpO2 100% Physical Exam  Constitutional: He is oriented to person, place, and time. He appears well-developed and well-nourished. No distress.  HENT:  Head: Normocephalic and atraumatic.  Right Ear: Hearing normal.  Left Ear: Hearing normal.  Nose: Nose normal.  Mouth/Throat: Oropharynx is clear and moist and mucous membranes are normal.  Eyes: Conjunctivae and EOM are normal. Pupils are equal, round, and reactive to light.  Neck: Normal range of motion. Neck supple.  Cardiovascular: Regular rhythm, S1 normal and S2 normal.  Exam reveals no gallop and no friction rub.   No murmur heard. Pulmonary/Chest: Effort normal and breath sounds normal. No respiratory distress. He exhibits no tenderness.  Abdominal: Soft. Normal appearance and bowel sounds are normal. There is no hepatosplenomegaly. There is no tenderness. There is no rebound, no guarding, no tenderness  at McBurney's point and negative Murphy's sign. No hernia.  Musculoskeletal: Normal range of motion.  Neurological: He is alert and oriented to person, place, and time. He has normal strength. No cranial nerve deficit or sensory deficit. Coordination normal. GCS eye subscore is 4. GCS verbal subscore is 5. GCS motor subscore is 6.  Skin: Skin is warm, dry and intact. No rash noted. No cyanosis.  Psychiatric: He has a normal mood and affect. His speech is normal and behavior is  normal. Thought content normal.  Nursing note and vitals reviewed.   ED Course  Procedures (including critical care time) Labs Review Labs Reviewed  CBC WITH DIFFERENTIAL/PLATELET - Abnormal; Notable for the following:    WBC 25.6 (*)    MCHC 36.4 (*)    Platelets 135 (*)    Neutrophils Relative % 92 (*)    Neutro Abs 23.5 (*)    Lymphocytes Relative 3 (*)    Monocytes Absolute 1.5 (*)    All other components within normal limits  COMPREHENSIVE METABOLIC PANEL - Abnormal; Notable for the following:    Sodium 121 (*)    Potassium 3.4 (*)    Chloride 90 (*)    Glucose, Bld 112 (*)    Calcium 8.3 (*)    Albumin 3.4 (*)    GFR calc non Af Amer 55 (*)    GFR calc Af Amer 64 (*)    All other components within normal limits  URINALYSIS, ROUTINE W REFLEX MICROSCOPIC - Abnormal; Notable for the following:    Specific Gravity, Urine <1.005 (*)    Hgb urine dipstick MODERATE (*)    Leukocytes, UA SMALL (*)    All other components within normal limits  TROPONIN I - Abnormal; Notable for the following:    Troponin I 0.04 (*)    All other components within normal limits  LIPASE, BLOOD  URINE MICROSCOPIC-ADD ON  I-STAT CG4 LACTIC ACID, ED    Imaging Review Dg Chest 2 View  05/11/2014   CLINICAL DATA:  Shortness of breath, chest pain with frequent hiccups, incontinence since noon, hypertension  EXAM: CHEST  2 VIEW  COMPARISON:  11/10/2010  FINDINGS: Enlargement of cardiac silhouette, tortuous aorta.  Mediastinal contours and pulmonary vascularity otherwise normal.  Minimal RIGHT basilar atelectasis.  Lungs otherwise clear.  No pleural effusion or pneumothorax.  LEFT glenohumeral degenerative changes.  4.0 x 3.9 cm diameter lucency with thin sclerotic rim projects over the LEFT scapula, new since 2012.  IMPRESSION: Enlargement of cardiac silhouette with minimal RIGHT basilar atelectasis.  4 cm lucency with thin sclerotic rim projects over the LEFT scapula; this could represent a lytic  process of the LEFT scapula or a faintly calcified loose body at the subcoracoid recess of the LEFT shoulder joint.  Dedicated LEFT shoulder radiographs recommended to assess.   Electronically Signed   By: Lavonia Dana M.D.   On: 05/11/2014 20:32     EKG Interpretation   Date/Time:  Friday May 11 2014 18:26:53 EST Ventricular Rate:  103 PR Interval:  202 QRS Duration: 96 QT Interval:  314 QTC Calculation: 411 R Axis:   -4 Text Interpretation:  Sinus tachycardia with frequent and consecutive  Premature ventricular complexes and Fusion complexes Left ventricular  hypertrophy with repolarization abnormality Cannot rule out Septal infarct  , age undetermined Abnormal ECG Confirmed by Harlym Gehling  MD, Derotha Fishbaugh  [phone]) on 05/11/2014 6:40:43 PM      MDM   Final diagnoses:  Shortness of breath  Abdominal  pain  Hiccups  Hyponatremia  Chest pain, unspecified chest pain type   Patient presents to the ER for evaluation of multiple problems. Patient reports onset of hiccups earlier today. They have been present for most of the day. Patient reports that he has developed abdominal pain as well as chest pain now. He also has mild shortness of breath. Abdominal exam revealed diffuse and mild tenderness, no guarding or rebound. Urinalysis unremarkable. Lactate was normal. CBC shows marked leukocytosis. Principal metabolic panel reveals significant hyponatremia, sodium 121. Remainder of CMP unremarkable.  EKG shows sinus rhythm with frequent PVCs as well as fusion complexes. Underlying rhythm and morphology, however, is unchanged from previous. He did have a very slightly elevated troponin of 0.04. He will require further workup with serial enzymes.  Patient will be admitted to the hospital for further cardiac monitoring and treatment and evaluation for hyponatremia. Based on the patient's leukocytosis and abdominal discomfort, CT abdomen has been ordered to further evaluate for intra-abdominal  process causing the discomfort and hiccups. Will be evaluated prior to admission.       Orpah Greek, MD 05/11/14 [place], MD 05/11/14 [phone]

## 2014-05-11 NOTE — ED Notes (Addendum)
Chest pain with frequent hiccups. No N/V ,alert, Given supp from [place], that pt pt thinks caused this problem.    Pt incontinent of urine at triage.  abd pain also

## 2014-05-11 NOTE — ED Notes (Signed)
Placed a condom catheter on the patient as per the nurse and placed a bed bag on the catheter.

## 2014-05-12 DIAGNOSIS — Z905 Acquired absence of kidney: Secondary | ICD-10-CM | POA: Diagnosis present

## 2014-05-12 DIAGNOSIS — R079 Chest pain, unspecified: Secondary | ICD-10-CM | POA: Insufficient documentation

## 2014-05-12 DIAGNOSIS — Z7982 Long term (current) use of aspirin: Secondary | ICD-10-CM | POA: Diagnosis not present

## 2014-05-12 DIAGNOSIS — I10 Essential (primary) hypertension: Secondary | ICD-10-CM | POA: Diagnosis present

## 2014-05-12 DIAGNOSIS — E871 Hypo-osmolality and hyponatremia: Secondary | ICD-10-CM | POA: Diagnosis present

## 2014-05-12 DIAGNOSIS — I509 Heart failure, unspecified: Secondary | ICD-10-CM | POA: Diagnosis not present

## 2014-05-12 DIAGNOSIS — Z85528 Personal history of other malignant neoplasm of kidney: Secondary | ICD-10-CM | POA: Diagnosis not present

## 2014-05-12 DIAGNOSIS — R066 Hiccough: Secondary | ICD-10-CM

## 2014-05-12 DIAGNOSIS — R791 Abnormal coagulation profile: Secondary | ICD-10-CM | POA: Diagnosis not present

## 2014-05-12 DIAGNOSIS — N390 Urinary tract infection, site not specified: Secondary | ICD-10-CM | POA: Diagnosis present

## 2014-05-12 DIAGNOSIS — D72829 Elevated white blood cell count, unspecified: Secondary | ICD-10-CM | POA: Diagnosis not present

## 2014-05-12 DIAGNOSIS — M1990 Unspecified osteoarthritis, unspecified site: Secondary | ICD-10-CM | POA: Diagnosis present

## 2014-05-12 DIAGNOSIS — E876 Hypokalemia: Secondary | ICD-10-CM | POA: Insufficient documentation

## 2014-05-12 DIAGNOSIS — R0789 Other chest pain: Secondary | ICD-10-CM | POA: Diagnosis present

## 2014-05-12 DIAGNOSIS — R0602 Shortness of breath: Secondary | ICD-10-CM | POA: Diagnosis not present

## 2014-05-12 DIAGNOSIS — R7989 Other specified abnormal findings of blood chemistry: Secondary | ICD-10-CM | POA: Diagnosis not present

## 2014-05-12 DIAGNOSIS — J449 Chronic obstructive pulmonary disease, unspecified: Secondary | ICD-10-CM | POA: Diagnosis present

## 2014-05-12 DIAGNOSIS — Z79899 Other long term (current) drug therapy: Secondary | ICD-10-CM | POA: Diagnosis not present

## 2014-05-12 LAB — CBC
HEMATOCRIT: 39.2 % (ref 39.0–52.0)
Hemoglobin: 13.9 g/dL (ref 13.0–17.0)
MCH: 31.3 pg (ref 26.0–34.0)
MCHC: 35.5 g/dL (ref 30.0–36.0)
MCV: 88.3 fL (ref 78.0–100.0)
Platelets: 146 10*3/uL — ABNORMAL LOW (ref 150–400)
RBC: 4.44 MIL/uL (ref 4.22–5.81)
RDW: 13.1 % (ref 11.5–15.5)
WBC: 21 10*3/uL — AB (ref 4.0–10.5)

## 2014-05-12 LAB — COMPREHENSIVE METABOLIC PANEL
ALBUMIN: 2.7 g/dL — AB (ref 3.5–5.2)
ALT: 15 U/L (ref 0–53)
AST: 17 U/L (ref 0–37)
Alkaline Phosphatase: 57 U/L (ref 39–117)
Anion gap: 11 (ref 5–15)
BUN: 18 mg/dL (ref 6–23)
CALCIUM: 8.3 mg/dL — AB (ref 8.4–10.5)
CO2: 21 mmol/L (ref 19–32)
CREATININE: 1.29 mg/dL (ref 0.50–1.35)
Chloride: 95 mmol/L — ABNORMAL LOW (ref 96–112)
GFR calc non Af Amer: 55 mL/min — ABNORMAL LOW (ref >90)
GFR, EST AFRICAN AMERICAN: 64 mL/min — AB (ref >90)
GLUCOSE: 90 mg/dL (ref 70–99)
Potassium: 3.3 mmol/L — ABNORMAL LOW (ref 3.5–5.1)
Sodium: 127 mmol/L — ABNORMAL LOW (ref 135–145)
TOTAL PROTEIN: 5.4 g/dL — AB (ref 6.0–8.3)
Total Bilirubin: 1.5 mg/dL — ABNORMAL HIGH (ref 0.3–1.2)

## 2014-05-12 LAB — CREATININE, SERUM
CREATININE: 1.17 mg/dL (ref 0.50–1.35)
GFR, EST AFRICAN AMERICAN: 72 mL/min — AB (ref >90)
GFR, EST NON AFRICAN AMERICAN: 62 mL/min — AB (ref >90)

## 2014-05-12 LAB — OSMOLALITY, URINE: Osmolality, Ur: 119 mOsm/kg — ABNORMAL LOW (ref 390–1090)

## 2014-05-12 LAB — TROPONIN I
Troponin I: 0.04 ng/mL — ABNORMAL HIGH (ref <0.031)
Troponin I: 0.05 ng/mL — ABNORMAL HIGH (ref <0.031)
Troponin I: 0.03 ng/mL (ref <0.031)

## 2014-05-12 LAB — SODIUM, URINE, RANDOM: Sodium, Ur: 8 mmol/L

## 2014-05-12 LAB — TSH: TSH: 1.151 u[IU]/mL (ref 0.350–4.500)

## 2014-05-12 MED ORDER — HEPARIN SODIUM (PORCINE) 5000 UNIT/ML IJ SOLN
5000.0000 [IU] | Freq: Three times a day (TID) | INTRAMUSCULAR | Status: DC
  Administered 2014-05-12 – 2014-05-14 (×7): 5000 [IU] via SUBCUTANEOUS
  Filled 2014-05-12 (×7): qty 1

## 2014-05-12 MED ORDER — POTASSIUM CHLORIDE CRYS ER 20 MEQ PO TBCR
40.0000 meq | EXTENDED_RELEASE_TABLET | Freq: Once | ORAL | Status: AC
  Administered 2014-05-12: 40 meq via ORAL
  Filled 2014-05-12: qty 2

## 2014-05-12 MED ORDER — CEFTRIAXONE SODIUM IN DEXTROSE 20 MG/ML IV SOLN
1.0000 g | INTRAVENOUS | Status: DC
  Administered 2014-05-12 – 2014-05-13 (×2): 1 g via INTRAVENOUS
  Filled 2014-05-12 (×4): qty 50

## 2014-05-12 MED ORDER — AMLODIPINE BESYLATE 5 MG PO TABS
10.0000 mg | ORAL_TABLET | Freq: Every day | ORAL | Status: DC
Start: 2014-05-12 — End: 2014-05-14
  Administered 2014-05-12 – 2014-05-14 (×3): 10 mg via ORAL
  Filled 2014-05-12 (×3): qty 2

## 2014-05-12 MED ORDER — SODIUM CHLORIDE 0.9 % IV SOLN
INTRAVENOUS | Status: DC
  Administered 2014-05-12: 07:00:00 via INTRAVENOUS

## 2014-05-12 MED ORDER — BACLOFEN 10 MG PO TABS
5.0000 mg | ORAL_TABLET | Freq: Two times a day (BID) | ORAL | Status: DC
  Administered 2014-05-12 – 2014-05-14 (×5): 5 mg via ORAL
  Filled 2014-05-12 (×5): qty 1

## 2014-05-12 MED ORDER — SODIUM CHLORIDE 0.9 % IJ SOLN
3.0000 mL | Freq: Two times a day (BID) | INTRAMUSCULAR | Status: DC
  Administered 2014-05-12 – 2014-05-14 (×4): 3 mL via INTRAVENOUS

## 2014-05-12 MED ORDER — SODIUM CHLORIDE 0.9 % IV SOLN
INTRAVENOUS | Status: DC
  Administered 2014-05-12 – 2014-05-13 (×2): via INTRAVENOUS

## 2014-05-12 MED ORDER — ACETAMINOPHEN 325 MG PO TABS
650.0000 mg | ORAL_TABLET | ORAL | Status: DC | PRN
  Administered 2014-05-12 – 2014-05-14 (×4): 650 mg via ORAL
  Filled 2014-05-12 (×4): qty 2

## 2014-05-12 MED ORDER — PANTOPRAZOLE SODIUM 40 MG PO TBEC
40.0000 mg | DELAYED_RELEASE_TABLET | Freq: Every day | ORAL | Status: DC
  Administered 2014-05-12 – 2014-05-14 (×3): 40 mg via ORAL
  Filled 2014-05-12 (×3): qty 1

## 2014-05-12 MED ORDER — ASPIRIN EC 81 MG PO TBEC
81.0000 mg | DELAYED_RELEASE_TABLET | Freq: Every day | ORAL | Status: DC
Start: 2014-05-12 — End: 2014-05-14
  Administered 2014-05-12 – 2014-05-14 (×3): 81 mg via ORAL
  Filled 2014-05-12 (×3): qty 1

## 2014-05-12 MED ORDER — LISINOPRIL 10 MG PO TABS
40.0000 mg | ORAL_TABLET | Freq: Every day | ORAL | Status: DC
  Administered 2014-05-12 – 2014-05-14 (×3): 40 mg via ORAL
  Filled 2014-05-12 (×3): qty 4

## 2014-05-12 MED ORDER — CHLORPROMAZINE HCL 100 MG PO TABS
50.0000 mg | ORAL_TABLET | Freq: Three times a day (TID) | ORAL | Status: DC | PRN
  Administered 2014-05-12 – 2014-05-13 (×2): 50 mg via ORAL
  Filled 2014-05-12 (×2): qty 1

## 2014-05-12 MED ORDER — SODIUM CHLORIDE 0.9 % IV SOLN
Freq: Once | INTRAVENOUS | Status: AC
  Administered 2014-05-12: 01:00:00 via INTRAVENOUS

## 2014-05-12 NOTE — Progress Notes (Addendum)
Patient ID: Bobby Sosa, male   DOB: 1946-02-28, 69 y.o.   MRN: [id number] [facility] PROGRESS NOTE  Bobby Sosa XNA:[id number] DOB: Sep 10, 1945 DOA: 05/11/2014 PCP: Purvis Kilts, MD  Brief narrative:    Addendum to admission note done 05/12/2014 69 y.o. male with history of RCC status post right nephrectomy, hypertension who presented to [facility] ED with complaints of hiccups which started 1 day PTA. Pt had some complaints of chest pain which per patient has resolved since admission. ED, pt was hemodynamically stable. Blood work showed WBC count of 25.6, sodium of 121, potassium of 3.4, milt troponin elevation at 0.04. The 12 lead EKG showed no acute ST T changes. CXR showed no infiltrats but there was abnormal density over the left shoulder. Left shoulder x ray did not show acute findings.   Assessment/Plan:    Principal Problem: Hiccups - unclear etiology - improved with thorazine PRN - added baclofen since pt still reports hiccups once thorazine wears of.  Active Problems: UTI / Leukocytosis  - small leukocytes seen on UA. - will start rocephin IV daily - follow up urine culture results  Chest pain / mild troponin level elevation - pt has had small elevation in troponins - no acute ischemic changes on 12 lead EKG - obtain 2 D ECHO  Hyponatremia - unclear etiology - normal urine sodium, urine osmolarity pending - TSH is WNL  Hypokalemia - unclear etiology - supplemented     DVT Prophylaxis  - Heparin subQ ordered    Code Status: Full.  Family Communication:  plan of care discussed with the patient Disposition Plan: likely home in next 24 hours if he feels better. Still shoulder pain.  IV access:  Peripheral IV  Procedures and diagnostic studies:    Dg Chest 2 View 05/11/2014   Enlargement of cardiac silhouette with minimal RIGHT basilar atelectasis.  4 cm lucency with thin sclerotic rim projects over the LEFT scapula; this could represent a lytic  process of the LEFT scapula or a faintly calcified loose body at the subcoracoid recess of the LEFT shoulder joint.  Dedicated LEFT shoulder radiographs recommended to assess.    Ct Abdomen Pelvis W Contras 05/11/2014  1. There is small left pleural effusion with left base posterior atelectasis. 2. Mild hepatic fatty infiltration. 3. Degenerative changes lumbar spine. 4. Status post right nephrectomy. No left hydronephrosis or hydroureter. 5. No pericecal inflammation. 6. Significant enlarged prostate gland with indentation of urinary bladder base. Follow-up urology exam is recommended. 7. There is mild thickening of urinary bladder wall. Chronic inflammation or cystitis cannot be excluded. Clinical correlation is necessary.     Dg Shoulder Left 05/11/2014 Abnormality noted on the current chest radiograph appears to reside anterior to the scapula and below the coracoid process. This may reflect intra-articular body. This is supported by advanced arthropathic changes of the glenohumeral joint. There are no findings suspicious for neoplastic disease to bone. No fracture or acute finding.    Medical Consultants:  None   Other Consultants:  None   IAnti-Infectives:   Rocephin 05/12/2014 -->   Leisa Lenz, MD  [facility] Pager [phone]  If 7PM-7AM, please contact night-coverage [email or web address] Password [email or web address] 05/12/2014, 12:02 PM   LOS: 0 days    HPI/Subjective: No acute overnight events.  Objective: Filed Vitals:   05/11/14 2250 05/12/14 0027 05/12/14 0620 05/12/14 1145  BP: 153/65 155/75 144/80   Pulse: 65 90    Temp: 97.8 F (36.6 C) 98.2 F (36.8 C)  100.2 F (37.9 C) 98.6 F (37 C)  TempSrc: Oral Oral Oral Axillary  Resp: 18 17    Height:      Weight:      SpO2: 100% 98% 98%     Intake/Output Summary (Last 24 hours) at 05/12/14 1202 Last data filed at 05/12/14 0858  Gross per 24 hour  Intake    123 ml  Output      0 ml  Net    123 ml    Exam:   General:  Pt  is alert, follows commands appropriately, not in acute distress  Cardiovascular: Regular rate and rhythm, S1/S2, no murmurs  Respiratory: Clear to auscultation bilaterally, no wheezing, no crackles, no rhonchi  Abdomen: Soft, non tender, non distended, bowel sounds present  Extremities: No edema, pulses DP and PT palpable bilaterally  Neuro: Grossly nonfocal  Data Reviewed: Basic Metabolic Panel:  Recent Labs Lab 05/11/14 1859 05/12/14 0033 05/12/14 0640  NA 121*  --  127*  K 3.4*  --  3.3*  CL 90*  --  95*  CO2 21  --  21  GLUCOSE 112*  --  90  BUN 18  --  18  CREATININE 1.29 1.17 1.29  CALCIUM 8.3*  --  8.3*   Liver Function Tests:  Recent Labs Lab 05/11/14 1859 05/12/14 0640  AST 22 17  ALT 15 15  ALKPHOS 62 57  BILITOT 1.2 1.5*  PROT 6.9 5.4*  ALBUMIN 3.4* 2.7*    Recent Labs Lab 05/11/14 1859  LIPASE 20   No results for input(s): AMMONIA in the last 168 hours. CBC:  Recent Labs Lab 05/11/14 1859 05/12/14 0640  WBC 25.6* 21.0*  NEUTROABS 23.5*  --   HGB 14.7 13.9  HCT 40.4 39.2  MCV 88.0 88.3  PLT 135* 146*   Cardiac Enzymes:  Recent Labs Lab 05/11/14 1859 05/12/14 0033 05/12/14 0640  TROPONINI 0.04* 0.04* 0.05*   BNP: Invalid input(s): POCBNP CBG: No results for input(s): GLUCAP in the last 168 hours.  Culture, blood (routine x 2)     Status: None (Preliminary result)   Collection Time: 05/12/14 12:33 AM  Result Value Ref Range Status   Specimen Description BLOOD RIGHT HAND  Final   Culture NO GROWTH <24 HRS  Final   Report Status PENDING  Incomplete  Culture, blood (routine x 2)     Status: None (Preliminary result)   Collection Time: 05/12/14 12:36 AM  Result Value Ref Range Status   Specimen Description BLOOD RIGHT HAND  Final   Culture NO GROWTH <24 HRS  Final   Report Status PENDING  Incomplete     Scheduled Meds: . amLODipine  10 mg Oral Daily  . aspirin EC  81 mg Oral Daily  . baclofen  5 mg Oral BID  .  cefTRIAXone (ROCEPHIN)  IV  1 g Intravenous Q24H  . heparin  5,000 Units Subcutaneous 3 times per day  . lisinopril  40 mg Oral Daily  . pantoprazole  40 mg Oral Daily  . potassium chloride  40 mEq Oral Once  . sodium chloride  3 mL Intravenous Q12H   Continuous Infusions: . sodium chloride

## 2014-05-12 NOTE — Progress Notes (Signed)
Was notified by central telemetry that patient was experiencing vtach. On the strip it showed multiple PVCs. Patient was asymptomatic and without pain. MD was notified. Patient currently stable.

## 2014-05-12 NOTE — H&P (Signed)
[facility] History and Physical  Bobby Sosa AST:[id number] DOB: 06-Jan-1946    PCP:   Purvis Kilts, MD   Chief Complaint: chest pain with hiccups.   HPI: Bobby Sosa is an 69 y.o. 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 He said he has chest and abdominal pain when he has these hiccups.  He denied SOB, nausea, vomiting, but was having soft, non diarrheal stool.  Evaluation in the ER showed Na of 121, with last value seen 4 years ago, not on diuretics, and drinking no alcohol, along with having a leukocytosis with WBC of 25K.  He denied coughs, fever, chills, dyusria, abdominal pain without hiccups, rash, HA, sorethroat or any other symptomology.  His EKG showed no acute ST T changes, and his troponin was 0.04.  His CXR showed no infiltrate, but there were abnormal density over the left shoulder, and dedicated left shoulder film done showing no acute process, no bony lesion (no osteoblastic or osteolytic lesion) Hospitalist was asked to admit him for further evaluations.   Rewiew of Systems:  Constitutional: Negative for malaise, fever and chills. No significant weight loss or weight gain Eyes: Negative for eye pain, redness and discharge, diplopia, visual changes, or flashes of light. ENMT: Negative for ear pain, hoarseness, nasal congestion, sinus pressure and sore throat. No headaches; tinnitus, drooling, or problem swallowing. Cardiovascular: Negative for chest pain, palpitations, diaphoresis, dyspnea and peripheral edema. ; No orthopnea, PND Respiratory: Negative for cough, hemoptysis, wheezing and stridor. No pleuritic chestpain. Gastrointestinal: Negative for nausea, vomiting, diarrhea, constipation, abdominal pain, melena, blood in stool, hematemesis, jaundice and rectal bleeding.    Genitourinary: Negative for frequency, dysuria,  incontinence,flank pain and hematuria; Musculoskeletal: Negative for back pain and neck pain. Negative for swelling and trauma.;  Skin: . Negative for pruritus, rash, abrasions, bruising and skin lesion.; ulcerations Neuro: Negative for headache, lightheadedness and neck stiffness. Negative for weakness, altered level of consciousness , altered mental status, extremity weakness, burning feet, involuntary movement, seizure and syncope.  Psych: negative for anxiety, depression, insomnia, tearfulness, panic attacks, hallucinations, paranoia, suicidal or homicidal ideation    Past Medical History  Diagnosis Date  . Arthritis   . Hypertension     Past Surgical History  Procedure Laterality Date  . Leg surgery    . Abdominal surgery    . Nasal polyp surgery      Medications:  HOME MEDS: Prior to Admission medications   Medication Sig Start Date End Date Taking? Authorizing Provider  amLODipine (NORVASC) 10 MG tablet Take 10 mg by mouth daily.   Yes Historical Provider, MD  ANUCORT-HC 25 MG suppository Place 25 mg rectally every 6 (six) hours as needed. 05/10/14  Yes Historical Provider, MD  aspirin EC 81 MG tablet Take 81 mg by mouth daily.   Yes Historical Provider, MD  lisinopril (PRINIVIL,ZESTRIL) 40 MG tablet Take 40 mg by mouth daily. 05/10/14  Yes Historical Provider, MD  omeprazole (PRILOSEC) 20 MG capsule Take 20 mg by mouth daily.   Yes Historical Provider, MD     Allergies:  No Known Allergies  Social History:   reports that he has never smoked. He does not have any smokeless tobacco history on file. He reports that he does not drink alcohol or use illicit drugs.  Family History: Family History  Problem Relation Age of Onset  .  Diabetes Mother   . Diabetes Father      Physical Exam: Filed Vitals:   05/11/14 1832 05/11/14 2015 05/11/14 2036 05/11/14 2250  BP: 107/67  153/71 153/65  Pulse: 98 93 96 65  Temp: 99.3 F (37.4 C)  98.1 F (36.7 C) 97.8 F (36.6 C)   TempSrc: Oral  Oral Oral  Resp: 16 18 18 18   Height: 5\' 11"  (1.803 m)     Weight: 99.791 kg (220 lb)     SpO2: 100% 98% 100% 100%   Blood pressure 153/65, pulse 65, temperature 97.8 F (36.6 C), temperature source Oral, resp. rate 18, height 5\' 11"  (1.803 m), weight 99.791 kg (220 lb), SpO2 100 %.  GEN:  Pleasant  patient lying in the stretcher in no acute distress; cooperative with exam. PSYCH:  alert and oriented x4; does not appear anxious or depressed; affect is appropriate. HEENT: Mucous membranes pink and anicteric; PERRLA; EOM intact; no cervical lymphadenopathy nor thyromegaly or carotid bruit; no JVD; There were no stridor. Neck is very supple. Breasts:: Not examined CHEST WALL: No tenderness CHEST: Normal respiration, clear to auscultation bilaterally.  HEART: Regular rate and rhythm.  There are no murmur, rub, or gallops.   BACK: No kyphosis or scoliosis; no CVA tenderness ABDOMEN: soft and non-tender; no masses, no organomegaly, normal abdominal bowel sounds; no pannus; no intertriginous candida. There is no rebound and no distention. Rectal Exam: Not done EXTREMITIES: No bone or joint deformity; age-appropriate arthropathy of the hands and knees; no edema; no ulcerations.  There is no calf tenderness. Genitalia: not examined PULSES: 2+ and symmetric SKIN: Normal hydration no rash or ulceration CNS: Cranial nerves 2-12 grossly intact no focal lateralizing neurologic deficit.  Speech is fluent; uvula elevated with phonation, facial symmetry and tongue midline. DTR are normal bilaterally, cerebella exam is intact, barbinski is negative and strengths are equaled bilaterally.  No sensory loss.   Labs on Admission:  Basic Metabolic Panel:  Recent Labs Lab 05/11/14 1859  NA 121*  K 3.4*  CL 90*  CO2 21  GLUCOSE 112*  BUN 18  CREATININE 1.29  CALCIUM 8.3*   Liver Function Tests:  Recent Labs Lab 05/11/14 1859  AST 22  ALT 15  ALKPHOS 62  BILITOT 1.2  PROT 6.9   ALBUMIN 3.4*    Recent Labs Lab 05/11/14 1859  LIPASE 20   No results for input(s): AMMONIA in the last 168 hours. CBC:  Recent Labs Lab 05/11/14 1859  WBC 25.6*  NEUTROABS 23.5*  HGB 14.7  HCT 40.4  MCV 88.0  PLT 135*   Cardiac Enzymes:  Recent Labs Lab 05/11/14 1859  TROPONINI 0.04*   Radiological Exams on Admission: Dg Chest 2 View  05/11/2014   CLINICAL DATA:  Shortness of breath, chest pain with frequent hiccups, incontinence since noon, hypertension  EXAM: CHEST  2 VIEW  COMPARISON:  11/10/2010  FINDINGS: Enlargement of cardiac silhouette, tortuous aorta.  Mediastinal contours and pulmonary vascularity otherwise normal.  Minimal RIGHT basilar atelectasis.  Lungs otherwise clear.  No pleural effusion or pneumothorax.  LEFT glenohumeral degenerative changes.  4.0 x 3.9 cm diameter lucency with thin sclerotic rim projects over the LEFT scapula, new since 2012.  IMPRESSION: Enlargement of cardiac silhouette with minimal RIGHT basilar atelectasis.  4 cm lucency with thin sclerotic rim projects over the LEFT scapula; this could represent a lytic process of the LEFT scapula or a faintly calcified loose body at the subcoracoid recess of the LEFT shoulder joint.  Dedicated LEFT shoulder radiographs recommended to assess.   Electronically Signed   By: Lavonia Dana M.D.   On: 05/11/2014 20:32   Ct Abdomen Pelvis W Contrast  05/11/2014   CLINICAL DATA:  Generalized abdominal pain, history of renal cell carcinoma  EXAM: CT ABDOMEN AND PELVIS WITH CONTRAST  TECHNIQUE: Multidetector CT imaging of the abdomen and pelvis was performed using the standard protocol following bolus administration of intravenous contrast.  CONTRAST:  139mL OMNIPAQUE IOHEXOL 300 MG/ML SOLN, 65mL OMNIPAQUE IOHEXOL 300 MG/ML SOLN  COMPARISON:  12/17/2010  FINDINGS: There is trace left pleural effusion with left base posterior atelectasis. Sagittal images of the spine shows degenerative changes thoracolumbar spine.  Significant disc space flattening at L5-S1 level. Mild posterior disc bulge at L4-L5 level. Facet degenerative changes at L4 level.  There is mild hepatic fatty infiltration. Gallbladder is contracted. No calcified gallstones are noted within gallbladder. The pancreas spleen and adrenal glands are unremarkable. The patient is status post right nephrectomy. There is normal enhancement of the left kidney. Delayed renal images shows normal excretion of the left kidney. There is no left hydronephrosis or hydroureter. Visualized proximal left ureter is unremarkable.  No small bowel obstruction. No ascites or free air. There is no pericecal inflammation. Normal appendix is partially visualized.  There is significant and large prostate gland with indentation of urinary bladder base. Prostate gland measures at least 8.3 x 8 cm. Correlation with urology exam is recommended. There is thickening of urinary bladder wall. This may be due to chronic inflammation or cystitis. Clinical correlation is necessary.  No pelvic ascites or adenopathy. No inguinal adenopathy. No destructive bony lesions are noted within pelvis. Degenerative changes bilateral SI joints.  IMPRESSION: 1. There is small left pleural effusion with left base posterior atelectasis. 2. Mild hepatic fatty infiltration. 3. Degenerative changes lumbar spine. 4. Status post right nephrectomy. No left hydronephrosis or hydroureter. 5. No pericecal inflammation. 6. Significant enlarged prostate gland with indentation of urinary bladder base. Follow-up urology exam is recommended. 7. There is mild thickening of urinary bladder wall. Chronic inflammation or cystitis cannot be excluded. Clinical correlation is necessary.   Electronically Signed   By: Lahoma Crocker M.D.   On: 05/11/2014 22:42   Dg Shoulder Left  05/11/2014   CLINICAL DATA:  Left shoulder films recommended by radiologist following abnormality shown on cxr earlier tonight. Pt denies shoulder pain or injury,  stated prior history of bone spurs in both shoulders.  EXAM: LEFT SHOULDER - 2+ VIEW  COMPARISON:  None.  FINDINGS: Bony abnormality seen on the current chest radiograph projects below the coracoid process on the current exam. This may reflect a large intra-articular body within the sub scapular recess or within the subcoracoid recess.  There are no osteoblastic or osteolytic lesions. There are no fractures.  There are advanced arthropathic changes of the glenohumeral joint with marked joint space narrowing, marginal osteophytes most prominently from the margins of the humeral head, mild subchondral sclerosis.  The [facility] joint is well maintained.  Soft tissues are otherwise unremarkable.  IMPRESSION: Abnormality noted on the current chest radiograph appears to reside anterior to the scapula and below the coracoid process. This may reflect intra-articular body. This is supported by advanced arthropathic changes of the glenohumeral joint. There are no findings suspicious for neoplastic disease to bone. No fracture or acute finding.   Electronically Signed   By: Lajean Manes M.D.   On: 05/11/2014 22:55    EKG: Independently reviewed. NSR with  no acute ST T changes.    Assessment/Plan Present on Admission:  . Chest pain, atypical . Hiccups . Hyponatremia . Leukocytosis . Atypical chest pain  PLAN:  I think his atypical chest pain is from his hiccups.  I have agreed to admit him for atypical cp r/out, so will cycle his troponins.  He will be given Thorazine for his hiccups since they have really been bothering him.  I am not sure what cause his hyponatremia, though it could be hypovolemic hyponatremia, but will get urine osmolality and Urine Na just to see if this is euvolemic hyponatremia.  Clinically, it is likely to be a chronic process.  His leukocytosis is puzzling as well, and given no definite source of infection, will do urine and blood culture, hold off on empiric antibiotics (normal lactic acid  also), and repeat CBC in the am.  For his HTN, will continue his meds.   Other plans as per orders.  Code Status: FULL Haskel Khan, MD. [facility] Pager [phone] 7pm to 7am.  05/12/2014, 12:06 AM

## 2014-05-12 NOTE — Progress Notes (Signed)
INITIAL NUTRITION ASSESSMENT  DOCUMENTATION CODES Per approved criteria  -Obesity Unspecified   INTERVENTION: -Monitor Oral intake and add snacks/supps as warranted.  NUTRITION DIAGNOSIS: Increased protein/energy needs related to stress state as evidenced by being in hospital with raised WBC.   Goal: Pt to meet >/= 90% of their estimated nutrition needs   Monitor:  Wt/Oral intake hx, oral intake, labs, precedures  Reason for Assessment: MST of 2  69 y.o. 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 He said he has chest and abdominal pain when he has these hiccups  Height: Ht Readings from Last 1 Encounters:  05/11/14 5\' 11"  (1.803 m)    Weight: Wt Readings from Last 1 Encounters:  05/11/14 220 lb (99.791 kg)    Ideal Body Weight: 172 lbs  % Ideal Body Weight: 128%  Wt Readings from Last 10 Encounters:  05/11/14 220 lb (99.791 kg)    Usual Body Weight: No wt hx  BMI:  Body mass index is 30.7 kg/(m^2).  Estimated Nutritional Needs: Kcal: 1500-1800 (15-18 kcal/kg) Protein: 86-102 (1.1-1.3 g/kg) Fluid: 1.5-1.8 liters  Skin: WDL  Diet Order: Diet Heart  EDUCATION NEEDS: -Education not appropriate at this time   Intake/Output Summary (Last 24 hours) at 05/12/14 1331 Last data filed at 05/12/14 1219  Gross per 24 hour  Intake    243 ml  Output    800 ml  Net   -557 ml  Ate 75% meal  Last BM: 2/19  Labs:   Recent Labs Lab 05/11/14 1859 05/12/14 0033 05/12/14 0640  NA 121*  --  127*  K 3.4*  --  3.3*  CL 90*  --  95*  CO2 21  --  21  BUN 18  --  18  CREATININE 1.29 1.17 1.29  CALCIUM 8.3*  --  8.3*  GLUCOSE 112*  --  90    CBG (last 3)  No results for input(s): GLUCAP in the last 72 hours.  Scheduled Meds: . amLODipine  10 mg  Oral Daily  . aspirin EC  81 mg Oral Daily  . baclofen  5 mg Oral BID  . cefTRIAXone (ROCEPHIN)  IV  1 g Intravenous Q24H  . heparin  5,000 Units Subcutaneous 3 times per day  . lisinopril  40 mg Oral Daily  . pantoprazole  40 mg Oral Daily  . sodium chloride  3 mL Intravenous Q12H    Continuous Infusions: . sodium chloride 75 mL/hr at 05/12/14 1206    Past Medical History  Diagnosis Date  . Arthritis   . Hypertension     Past Surgical History  Procedure Laterality Date  . Leg surgery    . Abdominal surgery    . Nasal polyp surgery      Burtis Junes RD, LDN Nutrition Pager: [phone] 05/12/2014 1:31 PM

## 2014-05-13 ENCOUNTER — Inpatient Hospital Stay (HOSPITAL_COMMUNITY): Payer: Medicare Other

## 2014-05-13 DIAGNOSIS — R7989 Other specified abnormal findings of blood chemistry: Secondary | ICD-10-CM

## 2014-05-13 DIAGNOSIS — I509 Heart failure, unspecified: Secondary | ICD-10-CM

## 2014-05-13 LAB — CBC WITH DIFFERENTIAL/PLATELET
BASOS PCT: 0 % (ref 0–1)
Basophils Absolute: 0 10*3/uL (ref 0.0–0.1)
EOS ABS: 0.1 10*3/uL (ref 0.0–0.7)
Eosinophils Relative: 0 % (ref 0–5)
HEMATOCRIT: 39.3 % (ref 39.0–52.0)
Hemoglobin: 13.8 g/dL (ref 13.0–17.0)
LYMPHS ABS: 0.7 10*3/uL (ref 0.7–4.0)
Lymphocytes Relative: 3 % — ABNORMAL LOW (ref 12–46)
MCH: 31.2 pg (ref 26.0–34.0)
MCHC: 35.1 g/dL (ref 30.0–36.0)
MCV: 88.9 fL (ref 78.0–100.0)
MONOS PCT: 9 % (ref 3–12)
Monocytes Absolute: 2.1 10*3/uL — ABNORMAL HIGH (ref 0.1–1.0)
Neutro Abs: 20.9 10*3/uL — ABNORMAL HIGH (ref 1.7–7.7)
Neutrophils Relative %: 88 % — ABNORMAL HIGH (ref 43–77)
Platelets: 167 10*3/uL (ref 150–400)
RBC: 4.42 MIL/uL (ref 4.22–5.81)
RDW: 13.4 % (ref 11.5–15.5)
WBC: 23.8 10*3/uL — ABNORMAL HIGH (ref 4.0–10.5)

## 2014-05-13 LAB — COMPREHENSIVE METABOLIC PANEL
ALBUMIN: 2.7 g/dL — AB (ref 3.5–5.2)
ALT: 17 U/L (ref 0–53)
ANION GAP: 7 (ref 5–15)
AST: 14 U/L (ref 0–37)
Alkaline Phosphatase: 58 U/L (ref 39–117)
BUN: 17 mg/dL (ref 6–23)
CO2: 22 mmol/L (ref 19–32)
CREATININE: 1.29 mg/dL (ref 0.50–1.35)
Calcium: 8.3 mg/dL — ABNORMAL LOW (ref 8.4–10.5)
Chloride: 104 mmol/L (ref 96–112)
GFR calc non Af Amer: 55 mL/min — ABNORMAL LOW (ref >90)
GFR, EST AFRICAN AMERICAN: 64 mL/min — AB (ref >90)
Glucose, Bld: 104 mg/dL — ABNORMAL HIGH (ref 70–99)
Potassium: 3.3 mmol/L — ABNORMAL LOW (ref 3.5–5.1)
Sodium: 133 mmol/L — ABNORMAL LOW (ref 135–145)
TOTAL PROTEIN: 6.1 g/dL (ref 6.0–8.3)
Total Bilirubin: 0.7 mg/dL (ref 0.3–1.2)

## 2014-05-13 LAB — SEDIMENTATION RATE: SED RATE: 67 mm/h — AB (ref 0–16)

## 2014-05-13 LAB — D-DIMER, QUANTITATIVE: D-Dimer, Quant: 3.58 ug/mL-FEU — ABNORMAL HIGH (ref 0.00–0.48)

## 2014-05-13 MED ORDER — TECHNETIUM TO 99M ALBUMIN AGGREGATED
6.0000 | Freq: Once | INTRAVENOUS | Status: AC | PRN
  Administered 2014-05-13: 6 via INTRAVENOUS

## 2014-05-13 MED ORDER — SODIUM CHLORIDE 0.9 % IV SOLN: INTRAVENOUS | Status: DC

## 2014-05-13 MED ORDER — POTASSIUM CHLORIDE CRYS ER 20 MEQ PO TBCR
40.0000 meq | EXTENDED_RELEASE_TABLET | Freq: Once | ORAL | Status: AC
  Administered 2014-05-13: 40 meq via ORAL
  Filled 2014-05-13: qty 2

## 2014-05-13 MED ORDER — TECHNETIUM TC 99M DIETHYLENETRIAME-PENTAACETIC ACID
40.0000 | Freq: Once | INTRAVENOUS | Status: AC | PRN
  Administered 2014-05-13: 40 via RESPIRATORY_TRACT

## 2014-05-13 MED ORDER — MORPHINE SULFATE 2 MG/ML IJ SOLN: 1.0000 mg | INTRAMUSCULAR | Status: DC | PRN

## 2014-05-13 NOTE — Progress Notes (Signed)
Pt has a temperature of 101.1 and BP 155/88. Spoke with Dr.Le in placed orders for blood work and wanted tylenol to be given at this time.Pt is resting with no complaints. I will continue to monitor.

## 2014-05-13 NOTE — Progress Notes (Signed)
Patient ID: Bobby Sosa, male   DOB: 31-Mar-1945, 69 y.o.   MRN: [id number] [facility] PROGRESS NOTE  Bobby Sosa BZJ:[id number] DOB: April 26, 1945 DOA: 05/11/2014 PCP: Purvis Kilts, MD  Brief narrative:    69 y.o. male with history of RCC status post right nephrectomy, hypertension who presented to [facility] ED with complaints of hiccups which started 1 day PTA. Pt had some complaints of chest pain which per patient has resolved since admission. ED, pt was hemodynamically stable. Blood work showed WBC count of 25.6, sodium of 121, potassium of 3.4, milt troponin elevation at 0.04. The 12 lead EKG showed no acute ST T changes. CXR showed no infiltrats but there was abnormal density over the left shoulder. Left shoulder x ray did not show acute findings.   Assessment/Plan:    Principal Problem: Hiccups - unclear etiology - improved with baclofen scheduled and thorazine PRN  Active Problems: UTI / Leukocytosis  - small leukocytes seen on UA. - continue rocephin daily - urine cultures pending  Elevated D dimer - V/Q scan normal - CXR with COPD changes, no acute findings   Chest pain / mild troponin level elevation - pt has had small elevation in troponins; repeat troponin level WNL - no acute ischemic changes seen on 12 lead EKG - 2 D ECHO is pending   Hyponatremia - unclear etiology - improved with IV fluids to 133 - TSH is WNL  Hypokalemia - unclear etiology - being supplemented     DVT Prophylaxis  - Heparin subQ ordered    Code Status: Full.  Family Communication:  plan of care discussed with the patient Disposition Plan: likely home in next 24 hours.  IV access:  Peripheral IV  Procedures and diagnostic studies:    Dg Chest 2 View 05/11/2014   Enlargement of cardiac silhouette with minimal RIGHT basilar atelectasis.  4 cm lucency with thin sclerotic rim projects over the LEFT scapula; this could represent a lytic process of the LEFT scapula or a faintly  calcified loose body at the subcoracoid recess of the LEFT shoulder joint.  Dedicated LEFT shoulder radiographs recommended to assess.    Ct Abdomen Pelvis W Contras 05/11/2014  1. There is small left pleural effusion with left base posterior atelectasis. 2. Mild hepatic fatty infiltration. 3. Degenerative changes lumbar spine. 4. Status post right nephrectomy. No left hydronephrosis or hydroureter. 5. No pericecal inflammation. 6. Significant enlarged prostate gland with indentation of urinary bladder base. Follow-up urology exam is recommended. 7. There is mild thickening of urinary bladder wall. Chronic inflammation or cystitis cannot be excluded. Clinical correlation is necessary.     Dg Shoulder Left 05/11/2014 Abnormality noted on the current chest radiograph appears to reside anterior to the scapula and below the coracoid process. This may reflect intra-articular body. This is supported by advanced arthropathic changes of the glenohumeral joint. There are no findings suspicious for neoplastic disease to bone. No fracture or acute finding.    Dg Chest 2 View 05/13/2014  COPD changes.  No acute abnormalities.   Nm Pulmonary Perf And Vent 05/13/2014  Normal perfusion lung scan.    Medical Consultants:  None   Other Consultants:  None   IAnti-Infectives:   Rocephin 05/12/2014 -->     Leisa Lenz, MD  [facility] Pager [phone]  If 7PM-7AM, please contact night-coverage [email or web address] Password [facility] 05/13/2014, 4:39 PM   LOS: 1 day    HPI/Subjective: No acute overnight events.  Objective: Filed Vitals:   05/12/14  2229 05/13/14 0534 05/13/14 0800 05/13/14 1459  BP: 124/72 155/88  136/72  Pulse: 99 101  95  Temp: 100.7 F (38.2 C) 101.1 F (38.4 C) 98.2 F (36.8 C) 98.8 F (37.1 C)  TempSrc: Oral Oral Oral Oral  Resp: 18 18  20   Height:      Weight:      SpO2: 97% 98%  99%    Intake/Output Summary (Last 24 hours) at 05/13/14 1639 Last data filed at 05/13/14  1309  Gross per 24 hour  Intake 1702.5 ml  Output   2900 ml  Net -1197.5 ml    Exam:   General:  Pt is not in distress   Cardiovascular: Regular rate and rhythm, S1/S2 (+)  Respiratory: no wheezing, no crackles, no rhonchi  Abdomen:  non distended, bowel sounds present  Extremities: No edema, pulses palpable  Neuro: Nonfocal  Data Reviewed: Basic Metabolic Panel:  Recent Labs Lab 05/11/14 1859 05/12/14 0033 05/12/14 0640 05/13/14 0644  NA 121*  --  127* 133*  K 3.4*  --  3.3* 3.3*  CL 90*  --  95* 104  CO2 21  --  21 22  GLUCOSE 112*  --  90 104*  BUN 18  --  18 17  CREATININE 1.29 1.17 1.29 1.29  CALCIUM 8.3*  --  8.3* 8.3*   Liver Function Tests:  Recent Labs Lab 05/11/14 1859 05/12/14 0640 05/13/14 0644  AST 22 17 14   ALT 15 15 17   ALKPHOS 62 57 58  BILITOT 1.2 1.5* 0.7  PROT 6.9 5.4* 6.1  ALBUMIN 3.4* 2.7* 2.7*    Recent Labs Lab 05/11/14 1859  LIPASE 20   No results for input(s): AMMONIA in the last 168 hours. CBC:  Recent Labs Lab 05/11/14 1859 05/12/14 0640 05/13/14 0644  WBC 25.6* 21.0* 23.8*  NEUTROABS 23.5*  --  20.9*  HGB 14.7 13.9 13.8  HCT 40.4 39.2 39.3  MCV 88.0 88.3 88.9  PLT 135* 146* 167   Cardiac Enzymes:  Recent Labs Lab 05/11/14 1859 05/12/14 0033 05/12/14 0640 05/12/14 1331  TROPONINI 0.04* 0.04* 0.05* 0.03   BNP: Invalid input(s): POCBNP CBG: No results for input(s): GLUCAP in the last 168 hours.  Recent Results (from the past 240 hour(s))  Culture, blood (routine x 2)     Status: None (Preliminary result)   Collection Time: 05/12/14 12:33 AM  Result Value Ref Range Status   Specimen Description BLOOD RIGHT HAND  Final   Special Requests BOTTLES DRAWN AEROBIC ONLY 5CC  Final   Culture NO GROWTH 1 DAY  Final   Report Status PENDING  Incomplete  Culture, blood (routine x 2)     Status: None (Preliminary result)   Collection Time: 05/12/14 12:36 AM  Result Value Ref Range Status   Specimen  Description BLOOD RIGHT HAND  Final   Special Requests BOTTLES DRAWN AEROBIC AND ANAEROBIC 6CC  Final   Culture NO GROWTH 1 DAY  Final   Report Status PENDING  Incomplete     Scheduled Meds: . amLODipine  10 mg Oral Daily  . aspirin EC  81 mg Oral Daily  . baclofen  5 mg Oral BID  . cefTRIAXone (ROCEPHIN)  IV  1 g Intravenous Q24H  . heparin  5,000 Units Subcutaneous 3 times per day  . lisinopril  40 mg Oral Daily  . pantoprazole  40 mg Oral Daily  . sodium chloride  3 mL Intravenous Q12H   Continuous Infusions: . sodium  chloride 10 mL/hr at 05/13/14 1547

## 2014-05-13 NOTE — Progress Notes (Signed)
  Echocardiogram 2D Echocardiogram has been performed.  Lysle Rubens 05/13/2014, 10:17 AM

## 2014-05-14 LAB — CBC
HCT: 38.9 % — ABNORMAL LOW (ref 39.0–52.0)
Hemoglobin: 13.4 g/dL (ref 13.0–17.0)
MCH: 30.9 pg (ref 26.0–34.0)
MCHC: 34.4 g/dL (ref 30.0–36.0)
MCV: 89.8 fL (ref 78.0–100.0)
PLATELETS: 193 10*3/uL (ref 150–400)
RBC: 4.33 MIL/uL (ref 4.22–5.81)
RDW: 13.8 % (ref 11.5–15.5)
WBC: 19.6 10*3/uL — AB (ref 4.0–10.5)

## 2014-05-14 LAB — BASIC METABOLIC PANEL
Anion gap: 8 (ref 5–15)
BUN: 12 mg/dL (ref 6–23)
CALCIUM: 8.4 mg/dL (ref 8.4–10.5)
CO2: 23 mmol/L (ref 19–32)
Chloride: 104 mmol/L (ref 96–112)
Creatinine, Ser: 1.11 mg/dL (ref 0.50–1.35)
GFR calc Af Amer: 77 mL/min — ABNORMAL LOW (ref >90)
GFR, EST NON AFRICAN AMERICAN: 66 mL/min — AB (ref >90)
Glucose, Bld: 101 mg/dL — ABNORMAL HIGH (ref 70–99)
Potassium: 3.4 mmol/L — ABNORMAL LOW (ref 3.5–5.1)
SODIUM: 135 mmol/L (ref 135–145)

## 2014-05-14 LAB — MAGNESIUM: Magnesium: 2.1 mg/dL (ref 1.5–2.5)

## 2014-05-14 MED ORDER — BACLOFEN 10 MG PO TABS: 5.0000 mg | ORAL_TABLET | Freq: Two times a day (BID) | ORAL | Status: DC

## 2014-05-14 MED ORDER — OXYCODONE-ACETAMINOPHEN 5-325 MG PO TABS: 1.0000 | ORAL_TABLET | ORAL | Status: AC | PRN

## 2014-05-14 MED ORDER — LEVOFLOXACIN 500 MG PO TABS: 500.0000 mg | ORAL_TABLET | Freq: Every day | ORAL | Status: DC

## 2014-05-14 MED ORDER — CHLORPROMAZINE HCL 50 MG PO TABS: 50.0000 mg | ORAL_TABLET | Freq: Three times a day (TID) | ORAL | Status: DC | PRN

## 2014-05-14 MED ORDER — ACETAMINOPHEN 325 MG PO TABS: 650.0000 mg | ORAL_TABLET | ORAL | Status: AC | PRN

## 2014-05-14 NOTE — Care Management Note (Signed)
    Page 1 of 1   05/14/2014     11:22:28 AM CARE MANAGEMENT NOTE 05/14/2014  Patient:  Bobby Sosa, Bobby Sosa   Account Number:  [id number]  Date Initiated:  05/14/2014  Documentation initiated by:  Theophilus Kinds  Subjective/Objective Assessment:   Pt admitted from home with UTI. Pt lives alone and has a son and sister who are very active in the care of the pt. Pt is independent with ADL's but does have a cane, walker, and w/c for home use.     Action/Plan:   Pt discharged home today. No CM needs noted.   Anticipated DC Date:  05/14/2014   Anticipated DC Plan:  [place]  CM consult      Choice offered to / List presented to:             Status of service:  Completed, signed off Medicare Important Message given?  YES (If response is "NO", the following Medicare IM given date fields will be blank) Date Medicare IM given:  05/14/2014 Medicare IM given by:  Theophilus Kinds Date Additional Medicare IM given:   Additional Medicare IM given by:    Discharge Disposition:  HOME/SELF CARE  Per UR Regulation:    If discussed at Long Length of Stay Meetings, dates discussed:    Comments:  05/14/14 [place], RN BSN CM

## 2014-05-14 NOTE — Discharge Summary (Signed)
Physician Discharge Summary  Bobby Sosa PYK:[id number] DOB: 07-Dec-1945 DOA: 05/11/2014  PCP: Purvis Kilts, MD  Admit date: 05/11/2014 Discharge date: 05/14/2014  Recommendations for Outpatient Follow-up:  1. Continue Levaquin for 5 days on discharge for urinary tract infection.  Discharge Diagnoses:  Active Problems:   Chest pain, atypical   Hiccups   Hyponatremia   Leukocytosis   Atypical chest pain   Pain in the chest   Hypokalemia    Discharge Condition: stable; potassium supplemented prior to discharge.  Diet recommendation: as tolerated   History of present illness:  69 y.o. male with history of RCC status post right nephrectomy, hypertension who presented to [facility] ED with complaints of hiccups which started 1 day PTA. Pt had some complaints of chest pain which per patient has resolved since admission. ED, pt was hemodynamically stable. Blood work showed WBC count of 25.6, sodium of 121, potassium of 3.4, milt troponin elevation at 0.04. The 12 lead EKG showed no acute ST T changes. CXR showed no infiltrats but there was abnormal density over the left shoulder. Left shoulder x ray did not show acute findings.   Assessment/Plan:    Principal Problem: Hiccups - unclear etiology - improved with baclofen scheduled and thorazine PRN, prescription provided   Active Problems: UTI / Leukocytosis  - small leukocytes seen on UA. - Patient was on Rocephin since admission. Patient will continue taking Levaquin for 5 more days on discharge - Urine culture not collected at the time of the admission.   Elevated D dimer - V/Q scan normal - CXR with COPD changes, no acute findings   Chest pain / mild troponin level elevation - pt has had small elevation in troponins; troponin level subsequently normalized - no acute ischemic changes seen on 12 lead EKG - 2 D ECHO is pending - will follow up on results    Essential hypertension - Continue Norvasc and  lisinopril  Hyponatremia - unclear etiology - Sodium level normalized with IV fluids. - TSH is WNL  Hypokalemia - unclear etiology - Supplemented.    DVT Prophylaxis  - Heparin subQ ordered while patient is in hospital   Code Status: Full.  Family Communication: plan of care discussed with the patient   IV access:  Peripheral IV  Procedures and diagnostic studies:   Dg Chest 2 View 05/11/2014 Enlargement of cardiac silhouette with minimal RIGHT basilar atelectasis. 4 cm lucency with thin sclerotic rim projects over the LEFT scapula; this could represent a lytic process of the LEFT scapula or a faintly calcified loose body at the subcoracoid recess of the LEFT shoulder joint. Dedicated LEFT shoulder radiographs recommended to assess.   Ct Abdomen Pelvis W Contras 05/11/2014 1. There is small left pleural effusion with left base posterior atelectasis. 2. Mild hepatic fatty infiltration. 3. Degenerative changes lumbar spine. 4. Status post right nephrectomy. No left hydronephrosis or hydroureter. 5. No pericecal inflammation. 6. Significant enlarged prostate gland with indentation of urinary bladder base. Follow-up urology exam is recommended. 7. There is mild thickening of urinary bladder wall. Chronic inflammation or cystitis cannot be excluded. Clinical correlation is necessary.   Dg Shoulder Left 05/11/2014 Abnormality noted on the current chest radiograph appears to reside anterior to the scapula and below the coracoid process. This may reflect intra-articular body. This is supported by advanced arthropathic changes of the glenohumeral joint. There are no findings suspicious for neoplastic disease to bone. No fracture or acute finding.   Dg Chest 2 View 05/13/2014 COPD  changes. No acute abnormalities.   Nm Pulmonary Perf And Vent 05/13/2014 Normal perfusion lung scan.   Medical Consultants:  None   Other Consultants:  None   IAnti-Infectives:    Rocephin 05/12/2014 --> 05/14/2014 Signed:  Leisa Lenz, MD  [facility] 05/14/2014, 9:35 AM  Pager #: [phone]   Discharge Exam: Filed Vitals:   05/14/14 0536  BP: 144/85  Pulse: 102  Temp: 100.5 F (38.1 C)  Resp: 20   Filed Vitals:   05/13/14 0800 05/13/14 1459 05/13/14 2055 05/14/14 0536  BP:  136/72 147/80 144/85  Pulse:  95 90 102  Temp: 98.2 F (36.8 C) 98.8 F (37.1 C) 99.1 F (37.3 C) 100.5 F (38.1 C)  TempSrc: Oral Oral Oral Oral  Resp:  20 18 20   Height:      Weight:      SpO2:  99% 98% 98%    General: Pt is alert, follows commands appropriately, not in acute distress Cardiovascular: Regular rate and rhythm, S1/S2 +, no murmurs Respiratory: Clear to auscultation bilaterally, no wheezing, no crackles, no rhonchi Abdominal: Soft, non tender, non distended, bowel sounds +, no guarding Extremities: no edema, no cyanosis, pulses palpable bilaterally DP and PT Neuro: Grossly nonfocal  Discharge Instructions  Discharge Instructions    Call MD for:  difficulty breathing, headache or visual disturbances    Complete by:  As directed      Call MD for:  persistant nausea and vomiting    Complete by:  As directed      Call MD for:  severe uncontrolled pain    Complete by:  As directed      Diet - low sodium heart healthy    Complete by:  As directed      Discharge instructions    Complete by:  As directed   1. Continue Levaquin for 5 days on discharge for urinary tract infection.     Increase activity slowly    Complete by:  As directed             Medication List    TAKE these medications        acetaminophen 325 MG tablet  Commonly known as:  TYLENOL  Take 2 tablets (650 mg total) by mouth every 4 (four) hours as needed for fever (greater than 101.5 F).     amLODipine 10 MG tablet  Commonly known as:  NORVASC  Take 10 mg by mouth daily.     ANUCORT-HC 25 MG suppository  Generic drug:  hydrocortisone  Place 25 mg rectally every 6  (six) hours as needed.     aspirin EC 81 MG tablet  Take 81 mg by mouth daily.     baclofen 10 MG tablet  Commonly known as:  LIORESAL  Take 0.5 tablets (5 mg total) by mouth 2 (two) times daily.     chlorproMAZINE 50 MG tablet  Commonly known as:  THORAZINE  Take 1 tablet (50 mg total) by mouth 3 (three) times daily as needed for hiccoughs.     levofloxacin 500 MG tablet  Commonly known as:  LEVAQUIN  Take 1 tablet (500 mg total) by mouth daily.     lisinopril 40 MG tablet  Commonly known as:  PRINIVIL,ZESTRIL  Take 40 mg by mouth daily.     omeprazole 20 MG capsule  Commonly known as:  PRILOSEC  Take 20 mg by mouth daily.     oxyCODONE-acetaminophen 5-325 MG per tablet  Commonly known as:  PERCOCET  Take 1 tablet by mouth every 4 (four) hours as needed for severe pain.           Follow-up Information    Follow up with Purvis Kilts, MD. Schedule an appointment as soon as possible for a visit in 1 week.   Specialty:  Family Medicine   Why:  Follow up appt after recent hospitalization   Contact information:   [address] [place] [address] [phone]        The results of significant diagnostics from this hospitalization (including imaging, microbiology, ancillary and laboratory) are listed below for reference.    Significant Diagnostic Studies: Dg Chest 2 View  05/13/2014   CLINICAL DATA:  Hypertension, followup  EXAM: CHEST  2 VIEW  COMPARISON:  05/11/2014  FINDINGS: Upper normal heart size.  Tortuous aorta.  Mediastinal contours and pulmonary vascularity normal.  Emphysematous and bronchitic changes question COPD.  No acute infiltrate, pleural effusion or pneumothorax.  Curvilinear density at the RIGHT shoulder has been shown by interval shoulder radiographs to be related to a large calcified loose body in the subcoracoid recess.  Degenerative changes LEFT glenohumeral joint.  No acute bone lesions identified.  IMPRESSION: COPD changes.  No  acute abnormalities.   Electronically Signed   By: Lavonia Dana M.D.   On: 05/13/2014 15:28   Dg Chest 2 View  05/11/2014   CLINICAL DATA:  Shortness of breath, chest pain with frequent hiccups, incontinence since noon, hypertension  EXAM: CHEST  2 VIEW  COMPARISON:  11/10/2010  FINDINGS: Enlargement of cardiac silhouette, tortuous aorta.  Mediastinal contours and pulmonary vascularity otherwise normal.  Minimal RIGHT basilar atelectasis.  Lungs otherwise clear.  No pleural effusion or pneumothorax.  LEFT glenohumeral degenerative changes.  4.0 x 3.9 cm diameter lucency with thin sclerotic rim projects over the LEFT scapula, new since 2012.  IMPRESSION: Enlargement of cardiac silhouette with minimal RIGHT basilar atelectasis.  4 cm lucency with thin sclerotic rim projects over the LEFT scapula; this could represent a lytic process of the LEFT scapula or a faintly calcified loose body at the subcoracoid recess of the LEFT shoulder joint.  Dedicated LEFT shoulder radiographs recommended to assess.   Electronically Signed   By: Lavonia Dana M.D.   On: 05/11/2014 20:32   Ct Abdomen Pelvis W Contrast  05/11/2014   CLINICAL DATA:  Generalized abdominal pain, history of renal cell carcinoma  EXAM: CT ABDOMEN AND PELVIS WITH CONTRAST  TECHNIQUE: Multidetector CT imaging of the abdomen and pelvis was performed using the standard protocol following bolus administration of intravenous contrast.  CONTRAST:  135mL OMNIPAQUE IOHEXOL 300 MG/ML SOLN, 32mL OMNIPAQUE IOHEXOL 300 MG/ML SOLN  COMPARISON:  12/17/2010  FINDINGS: There is trace left pleural effusion with left base posterior atelectasis. Sagittal images of the spine shows degenerative changes thoracolumbar spine. Significant disc space flattening at L5-S1 level. Mild posterior disc bulge at L4-L5 level. Facet degenerative changes at L4 level.  There is mild hepatic fatty infiltration. Gallbladder is contracted. No calcified gallstones are noted within gallbladder. The  pancreas spleen and adrenal glands are unremarkable. The patient is status post right nephrectomy. There is normal enhancement of the left kidney. Delayed renal images shows normal excretion of the left kidney. There is no left hydronephrosis or hydroureter. Visualized proximal left ureter is unremarkable.  No small bowel obstruction. No ascites or free air. There is no pericecal inflammation. Normal appendix is partially visualized.  There is significant and large prostate gland with  indentation of urinary bladder base. Prostate gland measures at least 8.3 x 8 cm. Correlation with urology exam is recommended. There is thickening of urinary bladder wall. This may be due to chronic inflammation or cystitis. Clinical correlation is necessary.  No pelvic ascites or adenopathy. No inguinal adenopathy. No destructive bony lesions are noted within pelvis. Degenerative changes bilateral SI joints.  IMPRESSION: 1. There is small left pleural effusion with left base posterior atelectasis. 2. Mild hepatic fatty infiltration. 3. Degenerative changes lumbar spine. 4. Status post right nephrectomy. No left hydronephrosis or hydroureter. 5. No pericecal inflammation. 6. Significant enlarged prostate gland with indentation of urinary bladder base. Follow-up urology exam is recommended. 7. There is mild thickening of urinary bladder wall. Chronic inflammation or cystitis cannot be excluded. Clinical correlation is necessary.   Electronically Signed   By: Lahoma Crocker M.D.   On: 05/11/2014 22:42   Nm Pulmonary Perf And Vent  05/13/2014   CLINICAL DATA:  Shortness of breath, hiccups, elevated D-dimer, hypertension  EXAM: NUCLEAR MEDICINE VENTILATION - PERFUSION LUNG SCAN  TECHNIQUE: Ventilation images were obtained in multiple projections using inhaled aerosol technetium 99 M DTPA. Perfusion images were obtained in multiple projections after intravenous injection of Tc-52m MAA.  RADIOPHARMACEUTICALS:  40 mCi Tc-37m DTPA aerosol and  6 mCi Tc-25m MAA  COMPARISON:  None  Correlation:  Chest radiographs 05/13/2014  FINDINGS: Ventilation: Central airway deposition of tracer. No definite focal ventilatory abnormalities.  Perfusion: Normal  Chest radiograph demonstrates upper normal size of cardiac silhouette and aortic tortuosity. Emphysematous and bronchitic changes suggesting COPD. No infiltrates.  IMPRESSION: Normal perfusion lung scan.   Electronically Signed   By: Lavonia Dana M.D.   On: 05/13/2014 15:08   Dg Shoulder Left  05/11/2014   CLINICAL DATA:  Left shoulder films recommended by radiologist following abnormality shown on cxr earlier tonight. Pt denies shoulder pain or injury, stated prior history of bone spurs in both shoulders.  EXAM: LEFT SHOULDER - 2+ VIEW  COMPARISON:  None.  FINDINGS: Bony abnormality seen on the current chest radiograph projects below the coracoid process on the current exam. This may reflect a large intra-articular body within the sub scapular recess or within the subcoracoid recess.  There are no osteoblastic or osteolytic lesions. There are no fractures.  There are advanced arthropathic changes of the glenohumeral joint with marked joint space narrowing, marginal osteophytes most prominently from the margins of the humeral head, mild subchondral sclerosis.  The [facility] joint is well maintained.  Soft tissues are otherwise unremarkable.  IMPRESSION: Abnormality noted on the current chest radiograph appears to reside anterior to the scapula and below the coracoid process. This may reflect intra-articular body. This is supported by advanced arthropathic changes of the glenohumeral joint. There are no findings suspicious for neoplastic disease to bone. No fracture or acute finding.   Electronically Signed   By: Lajean Manes M.D.   On: 05/11/2014 22:55    Microbiology: Recent Results (from the past 240 hour(s))  Culture, blood (routine x 2)     Status: None (Preliminary result)   Collection Time: 05/12/14 12:33  AM  Result Value Ref Range Status   Specimen Description BLOOD RIGHT HAND  Final   Special Requests BOTTLES DRAWN AEROBIC ONLY 5CC  Final   Culture NO GROWTH 1 DAY  Final   Report Status PENDING  Incomplete  Culture, blood (routine x 2)     Status: None (Preliminary result)   Collection Time: 05/12/14 12:36 AM  Result Value Ref Range Status   Specimen Description BLOOD RIGHT HAND  Final   Special Requests BOTTLES DRAWN AEROBIC AND ANAEROBIC 6CC  Final   Culture NO GROWTH 1 DAY  Final   Report Status PENDING  Incomplete     Labs: Basic Metabolic Panel:  Recent Labs Lab 05/11/14 1859 05/12/14 0033 05/12/14 0640 05/13/14 0644 05/14/14 0559  NA 121*  --  127* 133* 135  K 3.4*  --  3.3* 3.3* 3.4*  CL 90*  --  95* 104 104  CO2 21  --  21 22 23   GLUCOSE 112*  --  90 104* 101*  BUN 18  --  18 17 12   CREATININE 1.29 1.17 1.29 1.29 1.11  CALCIUM 8.3*  --  8.3* 8.3* 8.4  MG  --   --   --   --  2.1   Liver Function Tests:  Recent Labs Lab 05/11/14 1859 05/12/14 0640 05/13/14 0644  AST 22 17 14   ALT 15 15 17   ALKPHOS 62 57 58  BILITOT 1.2 1.5* 0.7  PROT 6.9 5.4* 6.1  ALBUMIN 3.4* 2.7* 2.7*    Recent Labs Lab 05/11/14 1859  LIPASE 20   No results for input(s): AMMONIA in the last 168 hours. CBC:  Recent Labs Lab 05/11/14 1859 05/12/14 0640 05/13/14 0644 05/14/14 0559  WBC 25.6* 21.0* 23.8* 19.6*  NEUTROABS 23.5*  --  20.9*  --   HGB 14.7 13.9 13.8 13.4  HCT 40.4 39.2 39.3 38.9*  MCV 88.0 88.3 88.9 89.8  PLT 135* 146* 167 193   Cardiac Enzymes:  Recent Labs Lab 05/11/14 1859 05/12/14 0033 05/12/14 0640 05/12/14 1331  TROPONINI 0.04* 0.04* 0.05* 0.03   BNP: BNP (last 3 results) No results for input(s): BNP in the last 8760 hours.  ProBNP (last 3 results) No results for input(s): PROBNP in the last 8760 hours.  CBG: No results for input(s): GLUCAP in the last 168 hours.  Time coordinating discharge: Over 30 minutes

## 2014-05-14 NOTE — Progress Notes (Signed)
UR chart review completed.  

## 2014-05-14 NOTE — Progress Notes (Signed)
Patient discharged with instructions, prescription, and care notes.  Verbalized understanding via teach back.  IV was removed and the site was WNL. Patient voiced no further complaints or concerns at the time of discharge.  Appointments scheduled per instructions.  Patient left the floor via ambulation with staff and family in stable condition. 

## 2014-05-14 NOTE — Discharge Instructions (Signed)

## 2014-05-15 DIAGNOSIS — R7881 Bacteremia: Secondary | ICD-10-CM

## 2014-05-15 DIAGNOSIS — Z6829 Body mass index (BMI) 29.0-29.9, adult: Secondary | ICD-10-CM | POA: Diagnosis not present

## 2014-05-15 DIAGNOSIS — I10 Essential (primary) hypertension: Secondary | ICD-10-CM | POA: Diagnosis not present

## 2014-05-15 DIAGNOSIS — R066 Hiccough: Secondary | ICD-10-CM | POA: Diagnosis not present

## 2014-05-15 DIAGNOSIS — N390 Urinary tract infection, site not specified: Secondary | ICD-10-CM | POA: Diagnosis not present

## 2014-05-15 NOTE — Progress Notes (Signed)
Lab called that one of the blood cultures grew GNR, will follow up on final results Pt was sent home on Levaquin Will update as final report available.  [facility]isa Lenz [facility] [phone]

## 2014-05-17 LAB — CULTURE, BLOOD (ROUTINE X 2): CULTURE: NO GROWTH

## 2014-05-18 LAB — CULTURE, BLOOD (ROUTINE X 2)
CULTURE: NO GROWTH
CULTURE: NO GROWTH

## 2014-05-20 ENCOUNTER — Encounter (HOSPITAL_COMMUNITY): Payer: Self-pay

## 2014-05-20 ENCOUNTER — Inpatient Hospital Stay (HOSPITAL_COMMUNITY)
Admission: EM | Admit: 2014-05-20 | Discharge: 2014-05-21 | DRG: 696 | Disposition: A | Discharge status: Home / Self Care | Payer: Medicare Other | Attending: Internal Medicine | Admitting: Internal Medicine

## 2014-05-20 DIAGNOSIS — Z792 Long term (current) use of antibiotics: Secondary | ICD-10-CM

## 2014-05-20 DIAGNOSIS — N3289 Other specified disorders of bladder: Secondary | ICD-10-CM

## 2014-05-20 DIAGNOSIS — E871 Hypo-osmolality and hyponatremia: Secondary | ICD-10-CM | POA: Diagnosis present

## 2014-05-20 DIAGNOSIS — R102 Pelvic and perineal pain: Secondary | ICD-10-CM | POA: Diagnosis not present

## 2014-05-20 DIAGNOSIS — Z7982 Long term (current) use of aspirin: Secondary | ICD-10-CM

## 2014-05-20 DIAGNOSIS — T433X5A Adverse effect of phenothiazine antipsychotics and neuroleptics, initial encounter: Secondary | ICD-10-CM | POA: Diagnosis present

## 2014-05-20 DIAGNOSIS — R300 Dysuria: Secondary | ICD-10-CM | POA: Diagnosis not present

## 2014-05-20 DIAGNOSIS — I10 Essential (primary) hypertension: Secondary | ICD-10-CM | POA: Diagnosis present

## 2014-05-20 DIAGNOSIS — M1990 Unspecified osteoarthritis, unspecified site: Secondary | ICD-10-CM | POA: Diagnosis present

## 2014-05-20 DIAGNOSIS — N390 Urinary tract infection, site not specified: Secondary | ICD-10-CM

## 2014-05-20 DIAGNOSIS — R339 Retention of urine, unspecified: Secondary | ICD-10-CM | POA: Diagnosis not present

## 2014-05-20 DIAGNOSIS — Z833 Family history of diabetes mellitus: Secondary | ICD-10-CM | POA: Diagnosis not present

## 2014-05-20 DIAGNOSIS — R330 Drug induced retention of urine: Principal | ICD-10-CM | POA: Diagnosis present

## 2014-05-20 DIAGNOSIS — Z905 Acquired absence of kidney: Secondary | ICD-10-CM | POA: Diagnosis present

## 2014-05-20 LAB — CBC WITH DIFFERENTIAL/PLATELET
BASOS PCT: 0 % (ref 0–1)
Basophils Absolute: 0 10*3/uL (ref 0.0–0.1)
EOS ABS: 0.1 10*3/uL (ref 0.0–0.7)
EOS PCT: 1 % (ref 0–5)
HCT: 42.8 % (ref 39.0–52.0)
HEMOGLOBIN: 14.8 g/dL (ref 13.0–17.0)
Lymphocytes Relative: 7 % — ABNORMAL LOW (ref 12–46)
Lymphs Abs: 1.1 10*3/uL (ref 0.7–4.0)
MCH: 31 pg (ref 26.0–34.0)
MCHC: 34.6 g/dL (ref 30.0–36.0)
MCV: 89.5 fL (ref 78.0–100.0)
MONO ABS: 1 10*3/uL (ref 0.1–1.0)
MONOS PCT: 6 % (ref 3–12)
NEUTROS ABS: 13.5 10*3/uL — AB (ref 1.7–7.7)
NEUTROS PCT: 86 % — AB (ref 43–77)
PLATELETS: 292 10*3/uL (ref 150–400)
RBC: 4.78 MIL/uL (ref 4.22–5.81)
RDW: 13.4 % (ref 11.5–15.5)
WBC: 15.7 10*3/uL — ABNORMAL HIGH (ref 4.0–10.5)

## 2014-05-20 LAB — URINALYSIS, ROUTINE W REFLEX MICROSCOPIC
Bilirubin Urine: NEGATIVE
GLUCOSE, UA: NEGATIVE mg/dL
Ketones, ur: NEGATIVE mg/dL
Nitrite: NEGATIVE
Protein, ur: NEGATIVE mg/dL
Urobilinogen, UA: 0.2 mg/dL (ref 0.0–1.0)
pH: 6 (ref 5.0–8.0)

## 2014-05-20 LAB — BASIC METABOLIC PANEL
Anion gap: 6 (ref 5–15)
BUN: 13 mg/dL (ref 6–23)
CALCIUM: 8.3 mg/dL — AB (ref 8.4–10.5)
CHLORIDE: 97 mmol/L (ref 96–112)
CO2: 21 mmol/L (ref 19–32)
Creatinine, Ser: 1.15 mg/dL (ref 0.50–1.35)
GFR calc Af Amer: 74 mL/min — ABNORMAL LOW (ref >90)
GFR, EST NON AFRICAN AMERICAN: 64 mL/min — AB (ref >90)
Glucose, Bld: 84 mg/dL (ref 70–99)
POTASSIUM: 3.5 mmol/L (ref 3.5–5.1)
Sodium: 124 mmol/L — ABNORMAL LOW (ref 135–145)

## 2014-05-20 LAB — URINE MICROSCOPIC-ADD ON

## 2014-05-20 LAB — HEMOGLOBIN AND HEMATOCRIT, BLOOD
HCT: 38.9 % — ABNORMAL LOW (ref 39.0–52.0)
Hemoglobin: 13.4 g/dL (ref 13.0–17.0)

## 2014-05-20 MED ORDER — ACETAMINOPHEN 325 MG PO TABS
650.0000 mg | ORAL_TABLET | Freq: Four times a day (QID) | ORAL | Status: DC | PRN
  Administered 2014-05-20 (×2): 650 mg via ORAL
  Filled 2014-05-20 (×2): qty 2

## 2014-05-20 MED ORDER — ONDANSETRON HCL 4 MG PO TABS: 4.0000 mg | ORAL_TABLET | Freq: Four times a day (QID) | ORAL | Status: DC | PRN

## 2014-05-20 MED ORDER — ASPIRIN EC 81 MG PO TBEC
81.0000 mg | DELAYED_RELEASE_TABLET | Freq: Every day | ORAL | Status: DC
  Administered 2014-05-20 – 2014-05-21 (×2): 81 mg via ORAL
  Filled 2014-05-20 (×2): qty 1

## 2014-05-20 MED ORDER — SODIUM CHLORIDE 0.9 % IV SOLN: 1000.0000 mL | INTRAVENOUS | Status: DC

## 2014-05-20 MED ORDER — LISINOPRIL 10 MG PO TABS
40.0000 mg | ORAL_TABLET | Freq: Every day | ORAL | Status: DC
  Administered 2014-05-20 – 2014-05-21 (×2): 40 mg via ORAL
  Filled 2014-05-20 (×2): qty 4

## 2014-05-20 MED ORDER — SODIUM CHLORIDE 0.9 % IV SOLN: INTRAVENOUS | Status: DC

## 2014-05-20 MED ORDER — CARVEDILOL 3.125 MG PO TABS
6.2500 mg | ORAL_TABLET | Freq: Every day | ORAL | Status: DC
  Administered 2014-05-20 – 2014-05-21 (×2): 6.25 mg via ORAL
  Filled 2014-05-20 (×2): qty 2

## 2014-05-20 MED ORDER — ONDANSETRON HCL 4 MG/2ML IJ SOLN
4.0000 mg | Freq: Once | INTRAMUSCULAR | Status: AC
  Administered 2014-05-20: 4 mg via INTRAVENOUS
  Filled 2014-05-20 (×2): qty 2

## 2014-05-20 MED ORDER — HYDROMORPHONE HCL 1 MG/ML IJ SOLN
1.0000 mg | Freq: Once | INTRAMUSCULAR | Status: AC
  Administered 2014-05-20: 1 mg via INTRAVENOUS
  Filled 2014-05-20 (×2): qty 1

## 2014-05-20 MED ORDER — MORPHINE SULFATE 2 MG/ML IJ SOLN: 1.0000 mg | INTRAMUSCULAR | Status: DC | PRN

## 2014-05-20 MED ORDER — OXYCODONE HCL 5 MG PO TABS: 5.0000 mg | ORAL_TABLET | ORAL | Status: DC | PRN

## 2014-05-20 MED ORDER — HYDROMORPHONE HCL 1 MG/ML IJ SOLN
1.0000 mg | INTRAMUSCULAR | Status: DC | PRN
  Administered 2014-05-20: 1 mg via INTRAVENOUS
  Filled 2014-05-20: qty 1

## 2014-05-20 MED ORDER — SODIUM CHLORIDE 0.9 % IV SOLN
1000.0000 mL | Freq: Once | INTRAVENOUS | Status: AC
  Administered 2014-05-20: 1000 mL via INTRAVENOUS

## 2014-05-20 MED ORDER — ONDANSETRON HCL 4 MG/2ML IJ SOLN: 4.0000 mg | Freq: Four times a day (QID) | INTRAMUSCULAR | Status: DC | PRN

## 2014-05-20 MED ORDER — SODIUM CHLORIDE 0.9 % IV SOLN
INTRAVENOUS | Status: DC
  Administered 2014-05-20: 75 mL/h via INTRAVENOUS
  Administered 2014-05-20: 23:00:00 via INTRAVENOUS

## 2014-05-20 MED ORDER — TAMSULOSIN HCL 0.4 MG PO CAPS
0.4000 mg | ORAL_CAPSULE | Freq: Every day | ORAL | Status: DC
  Administered 2014-05-20 – 2014-05-21 (×2): 0.4 mg via ORAL
  Filled 2014-05-20 (×2): qty 1

## 2014-05-20 MED ORDER — OXYCODONE-ACETAMINOPHEN 5-325 MG PO TABS
1.0000 | ORAL_TABLET | Freq: Once | ORAL | Status: AC
  Administered 2014-05-20: 1 via ORAL
  Filled 2014-05-20: qty 1

## 2014-05-20 MED ORDER — SENNOSIDES-DOCUSATE SODIUM 8.6-50 MG PO TABS: 1.0000 | ORAL_TABLET | Freq: Every evening | ORAL | Status: DC | PRN

## 2014-05-20 MED ORDER — PANTOPRAZOLE SODIUM 40 MG PO TBEC
40.0000 mg | DELAYED_RELEASE_TABLET | Freq: Every day | ORAL | Status: DC
  Administered 2014-05-20 – 2014-05-21 (×2): 40 mg via ORAL
  Filled 2014-05-20 (×2): qty 1

## 2014-05-20 MED ORDER — HEPARIN SODIUM (PORCINE) 5000 UNIT/ML IJ SOLN
5000.0000 [IU] | Freq: Three times a day (TID) | INTRAMUSCULAR | Status: DC
  Administered 2014-05-20 – 2014-05-21 (×3): 5000 [IU] via SUBCUTANEOUS
  Filled 2014-05-20 (×3): qty 1

## 2014-05-20 MED ORDER — ONDANSETRON HCL 4 MG/2ML IJ SOLN: 4.0000 mg | Freq: Three times a day (TID) | INTRAMUSCULAR | Status: DC | PRN

## 2014-05-20 MED ORDER — ACETAMINOPHEN 650 MG RE SUPP: 650.0000 mg | Freq: Four times a day (QID) | RECTAL | Status: DC | PRN

## 2014-05-20 MED ORDER — ALUM & MAG HYDROXIDE-SIMETH 200-200-20 MG/5ML PO SUSP: 30.0000 mL | Freq: Four times a day (QID) | ORAL | Status: DC | PRN

## 2014-05-20 MED ORDER — CEFTRIAXONE SODIUM IN DEXTROSE 20 MG/ML IV SOLN
1.0000 g | INTRAVENOUS | Status: DC
  Administered 2014-05-20: 1 g via INTRAVENOUS
  Filled 2014-05-20 (×3): qty 50

## 2014-05-20 MED ORDER — AMLODIPINE BESYLATE 5 MG PO TABS
5.0000 mg | ORAL_TABLET | Freq: Every day | ORAL | Status: DC
  Administered 2014-05-20 – 2014-05-21 (×2): 5 mg via ORAL
  Filled 2014-05-20 (×2): qty 1

## 2014-05-20 NOTE — Progress Notes (Signed)
Pt foley catheter emptied at beginning of shift and found to be pink with blood clots noted in the tubing. Pt states his urine was not this color earlier and requested the MD be notified to come evaluate. MD paged. Will continue to monitor patient.

## 2014-05-20 NOTE — Progress Notes (Signed)
1130 Late Entry: 05/20/2014  Patient yelling out and c/o being very uncomfortable with c/o supra abdominal pain.  MD recently seen the patient and we discussed interventions. She stated that the patients condom catheter is full and to try to empty to see if that would help.  After seeing the patient and emptying 1100 cc of clear urine I stated that we probably do the indwelling cath as she previously suggested.  I voiced to her that I would also do a bladder scan.  She verbalized understanding.   1145 Late Entry 05/20/2014  Indwelling catheter was placed and put out 700 cc of clear yellow urine.  The patient stated that she felt a lot better.  I notified Dr. Jerilee Hoh.

## 2014-05-20 NOTE — H&P (Signed)
[facility]          History and Physical    PCP:   Purvis Kilts, MD   Chief Complaint:  Suprapubic pain, inability to urinate  HPI: Patient is a 69 year old man with past medical history significant for hypertension. He was recently in the hospital and discharged on February 22 after being treated for hyponatremia, chest pain and a UTI. He only took his Levaquin for 2 days and then he felt better so he stopped taking it. During that hospitalization he also had some hiccups and was given a prescription for Thorazine upon discharge. He presents to the hospital today with increased suprapubic pain and inability to urinate. Bladder scan in the emergency department shows 250 mL in the bladder. Urinalysis does not show evidence for a UTI. His sodium is again low at 124 and he has a white count of 15.7. We have been asked to admit him for further evaluation and management.  Allergies:  No Known Allergies    Past Medical History  Diagnosis Date  . Arthritis   . Hypertension     Past Surgical History  Procedure Laterality Date  . Leg surgery    . Abdominal surgery    . Nasal polyp surgery      Prior to Admission medications   Medication Sig Start Date End Date Taking? Authorizing Provider  acetaminophen (TYLENOL) 325 MG tablet Take 2 tablets (650 mg total) by mouth every 4 (four) hours as needed for fever (greater than 101.5 F). 05/14/14  Yes Robbie Lis, MD  amLODipine (NORVASC) 10 MG tablet Take 5 mg by mouth daily.    Yes Historical Provider, MD  ANUCORT-HC 25 MG suppository Place 25 mg rectally every 6 (six) hours as needed. 05/10/14  Yes Historical Provider, MD  aspirin EC 81 MG tablet Take 81 mg by mouth daily.   Yes Historical Provider, MD  carvedilol (COREG) 6.25 MG tablet Take 6.25 mg by mouth daily.   Yes Historical Provider, MD  chlorproMAZINE (THORAZINE) 50 MG tablet Take 1 tablet (50 mg total) by mouth 3 (three) times daily as needed for  hiccoughs. 05/14/14  Yes Robbie Lis, MD  hydrochlorothiazide (HYDRODIURIL) 25 MG tablet Take 12.5 mg by mouth daily.   Yes Historical Provider, MD  levofloxacin (LEVAQUIN) 500 MG tablet Take 1 tablet (500 mg total) by mouth daily. 05/14/14  Yes Robbie Lis, MD  lisinopril (PRINIVIL,ZESTRIL) 40 MG tablet Take 40 mg by mouth daily. 05/10/14  Yes Historical Provider, MD  omeprazole (PRILOSEC) 20 MG capsule Take 20 mg by mouth daily.   Yes Historical Provider, MD  oxyCODONE-acetaminophen (PERCOCET) 5-325 MG per tablet Take 1 tablet by mouth every 4 (four) hours as needed for severe pain. 05/14/14  Yes Robbie Lis, MD  baclofen (LIORESAL) 10 MG tablet Take 0.5 tablets (5 mg total) by mouth 2 (two) times daily. Patient not taking: Reported on 05/20/2014 05/14/14   Robbie Lis, MD    Social History:  reports that he has never smoked. He does not have any smokeless tobacco history on file. He reports that he does not drink alcohol or use illicit drugs.  Family History  Problem Relation Age of Onset  . Diabetes Mother   . Diabetes Father     Review of Systems:  Constitutional: Denies fever, chills, diaphoresis, appetite change and fatigue.  HEENT: Denies photophobia, eye pain, redness,  hearing loss, ear pain, congestion, sore throat, rhinorrhea, sneezing, mouth sores, trouble swallowing, neck pain, neck stiffness and tinnitus.   Respiratory: Denies SOB, DOE, cough, chest tightness,  and wheezing.   Cardiovascular: Denies chest pain, palpitations and leg swelling.  Gastrointestinal: Denies nausea, vomiting, abdominal pain, diarrhea, constipation, blood in stool and abdominal distention.  Genitourinary: Positive for difficulty urinating.  Endocrine: Denies: hot or cold intolerance, sweats, changes in hair or nails, polyuria, polydipsia. Musculoskeletal: Denies myalgias, back pain, joint swelling, arthralgias and gait problem.  Skin: Denies pallor, rash and wound.  Neurological: Denies dizziness,  seizures, syncope, weakness, light-headedness, numbness and headaches.  Hematological: Denies adenopathy. Easy bruising, personal or family bleeding history  Psychiatric/Behavioral: Denies suicidal ideation, mood changes, confusion, nervousness, sleep disturbance and agitation   Physical Exam: Blood pressure 162/95, pulse 83, temperature 97.8 F (36.6 C), temperature source Oral, resp. rate 20, height 5' 11"  (1.803 m), weight 99.79 kg (220 lb), SpO2 100 %. General: Alert, awake, oriented 3, moderate distress secondary to suprapubic pain. HEENT: Normocephalic, atraumatic, pupils equal round and reactive to light, extraocular movements intact, moist mucous membranes, poor dentition, halitosis, mild pharyngeal erythema. Neck: Supple, no JVD, no lymphadenopathy, no bruits, no goiter. Cardiovascular: Regular rate and rhythm, no murmurs, rubs or gallops. Lungs: Clear to auscultation bilaterally. Abdomen: Soft, very tender to palpation to the suprapubic area, positive bowel sounds. Next and extremities: No clubbing, cyanosis or edema, positive pedal pulses. Neurologic: Grossly intact and nonfocal.  Labs on Admission:  Results for orders placed or performed during the hospital encounter of 05/20/14 (from the past 48 hour(s))  Urinalysis, Routine w reflex microscopic     Status: Abnormal   Collection Time: 05/20/14  4:20 AM  Result Value Ref Range   Color, Urine YELLOW YELLOW   APPearance CLEAR CLEAR   Specific Gravity, Urine <1.005 (L) 1.005 - 1.030   pH 6.0 5.0 - 8.0   Glucose, UA NEGATIVE NEGATIVE mg/dL   Hgb urine dipstick TRACE (A) NEGATIVE   Bilirubin Urine NEGATIVE NEGATIVE   Ketones, ur NEGATIVE NEGATIVE mg/dL   Protein, ur NEGATIVE NEGATIVE mg/dL   Urobilinogen, UA 0.2 0.0 - 1.0 mg/dL   Nitrite NEGATIVE NEGATIVE   Leukocytes, UA TRACE (A) NEGATIVE  Urine microscopic-add on     Status: None   Collection Time: 05/20/14  4:20 AM  Result Value Ref Range   Squamous Epithelial / LPF  RARE RARE   WBC, UA 3-6 <3 WBC/hpf   RBC / HPF 0-2 <3 RBC/hpf   Bacteria, UA RARE RARE  Basic metabolic panel     Status: Abnormal   Collection Time: 05/20/14  4:34 AM  Result Value Ref Range   Sodium 124 (L) 135 - 145 mmol/L   Potassium 3.5 3.5 - 5.1 mmol/L   Chloride 97 96 - 112 mmol/L   CO2 21 19 - 32 mmol/L   Glucose, Bld 84 70 - 99 mg/dL   BUN 13 6 - 23 mg/dL   Creatinine, Ser 1.15 0.50 - 1.35 mg/dL   Calcium 8.3 (L) 8.4 - 10.5 mg/dL   GFR calc non Af Amer 64 (L) >90 mL/min   GFR calc Af Amer 74 (L) >90 mL/min    Comment: (NOTE) The eGFR has been calculated using the CKD EPI equation. This calculation has not been validated in all clinical situations. eGFR's persistently <90 mL/min signify possible Chronic Kidney Disease.    Anion gap 6 5 - 15  CBC with Differential     Status: Abnormal  Collection Time: 05/20/14  4:34 AM  Result Value Ref Range   WBC 15.7 (H) 4.0 - 10.5 K/uL   RBC 4.78 4.22 - 5.81 MIL/uL   Hemoglobin 14.8 13.0 - 17.0 g/dL   HCT 42.8 39.0 - 52.0 %   MCV 89.5 78.0 - 100.0 fL   MCH 31.0 26.0 - 34.0 pg   MCHC 34.6 30.0 - 36.0 g/dL   RDW 13.4 11.5 - 15.5 %   Platelets 292 150 - 400 K/uL   Neutrophils Relative % 86 (H) 43 - 77 %   Neutro Abs 13.5 (H) 1.7 - 7.7 K/uL   Lymphocytes Relative 7 (L) 12 - 46 %   Lymphs Abs 1.1 0.7 - 4.0 K/uL   Monocytes Relative 6 3 - 12 %   Monocytes Absolute 1.0 0.1 - 1.0 K/uL   Eosinophils Relative 1 0 - 5 %   Eosinophils Absolute 0.1 0.0 - 0.7 K/uL   Basophils Relative 0 0 - 1 %   Basophils Absolute 0.0 0.0 - 0.1 K/uL    Radiological Exams on Admission: No results found.  Assessment/Plan Principal Problem:   Urinary retention Active Problems:   Hyponatremia   UTI (lower urinary tract infection)   Bladder spasms    Acute urinary retention -Suspect mostly related to Thorazine prescribed upon discharge a week ago for hiccups as well as probably a partially treated UTI. -We'll request a Foley catheter to be  placed, start Flomax. -Request urology consultation.  Partially treated UTI - Current urinalysis is negative, urine culture was not obtained during prior hospitalization. -We'll place on Rocephin and aim to treat for 3-5 days. -Obtain urine and blood cultures.  Bladder spasms -Related to above. -Start patient on Flomax.  Hyponatremia -Patient was noted to be drinking excessive amounts of water so question polydipsia. -For now will place on IV fluids 75 mL an hour. -Also note the patient takes hydrochlorothiazide at home and I will discontinue this at this point.  Hypertension -Well-controlled.  -Continue home medications  DVT prophylaxis -Subcutaneous heparin  CODE STATUS -Full code   Time Spent on Admission: 80 minutes  [facility] Pager: [phone] 05/20/2014, 11:41 AM

## 2014-05-20 NOTE — ED Provider Notes (Signed)
CSN: [id number]     Arrival date & time 05/20/14  0230 History   First MD Initiated Contact with Patient 05/20/14 0430     Chief Complaint  Patient presents with  . Urinary Tract Infection     (Consider location/radiation/quality/duration/timing/severity/associated sxs/prior Treatment) Patient is a 69 y.o. male presenting with urinary tract infection. The history is provided by the patient.  Urinary Tract Infection  He had recently been discharged the hospital and had been given a prescription for levofloxacin. He was feeling well, so he stopped taking it 2 days ago. This evening, he developed urinary frequency and urgency as well as pain in the left suprapubic area and pain when he urinated. He did take a dose of levofloxacin today. Pain is severe and he rates at 10/10. He states anytime he drinks something, he almost immediately has to urinate. He does feel as if he is emptying his bladder completely. He denies fever, chills, sweats. There is no nausea or vomiting.  Past Medical History  Diagnosis Date  . Arthritis   . Hypertension    Past Surgical History  Procedure Laterality Date  . Leg surgery    . Abdominal surgery    . Nasal polyp surgery     Family History  Problem Relation Age of Onset  . Diabetes Mother   . Diabetes Father    History  Substance Use Topics  . Smoking status: Never Smoker   . Smokeless tobacco: Not on file  . Alcohol Use: No    Review of Systems  All other systems reviewed and are negative.     Allergies  Review of patient's allergies indicates no known allergies.  Home Medications   Prior to Admission medications   Medication Sig Start Date End Date Taking? Authorizing Provider  acetaminophen (TYLENOL) 325 MG tablet Take 2 tablets (650 mg total) by mouth every 4 (four) hours as needed for fever (greater than 101.5 F). 05/14/14   Robbie Lis, MD  amLODipine (NORVASC) 10 MG tablet Take 10 mg by mouth daily.    Historical Provider, MD   ANUCORT-HC 25 MG suppository Place 25 mg rectally every 6 (six) hours as needed. 05/10/14   Historical Provider, MD  aspirin EC 81 MG tablet Take 81 mg by mouth daily.    Historical Provider, MD  baclofen (LIORESAL) 10 MG tablet Take 0.5 tablets (5 mg total) by mouth 2 (two) times daily. 05/14/14   Robbie Lis, MD  chlorproMAZINE (THORAZINE) 50 MG tablet Take 1 tablet (50 mg total) by mouth 3 (three) times daily as needed for hiccoughs. 05/14/14   Robbie Lis, MD  levofloxacin (LEVAQUIN) 500 MG tablet Take 1 tablet (500 mg total) by mouth daily. 05/14/14   Robbie Lis, MD  lisinopril (PRINIVIL,ZESTRIL) 40 MG tablet Take 40 mg by mouth daily. 05/10/14   Historical Provider, MD  omeprazole (PRILOSEC) 20 MG capsule Take 20 mg by mouth daily.    Historical Provider, MD  oxyCODONE-acetaminophen (PERCOCET) 5-325 MG per tablet Take 1 tablet by mouth every 4 (four) hours as needed for severe pain. 05/14/14   Robbie Lis, MD   BP 159/100 mmHg  Pulse 94  Temp(Src) 98.1 F (36.7 C) (Oral)  SpO2 100% Physical Exam  Nursing note and vitals reviewed.  69 year old male, who appears uncomfortable, but is in no acute distress. Vital signs are significant for hypertension. Oxygen saturation is 100%, which is normal. Head is normocephalic and atraumatic. PERRLA, EOMI. Oropharynx is clear.  Neck is nontender and supple without adenopathy or JVD. Back is nontender and there is no CVA tenderness. Lungs are clear without rales, wheezes, or rhonchi. Chest is nontender. Heart has regular rate and rhythm without murmur. Abdomen is soft, flat, with mild left suprapubic tenderness. There are no masses or hepatosplenomegaly and peristalsis is normoactive. Bladder does appear slightly distended. Genitalia: Uncircumcised penis. No phimosis or paraphimosis. Testes are descended without masses. There is marked tenderness in the left inguinal canal but no palpable hernia. Extremities have no cyanosis or edema, full range  of motion is present. Skin is warm and dry without rash. Neurologic: Mental status is normal, cranial nerves are intact, there are no motor or sensory deficits.  ED Course  Procedures (including critical care time) Labs Review Results for orders placed or performed during the hospital encounter of 06/10/20  Basic metabolic panel  Result Value Ref Range   Sodium 124 (L) 135 - 145 mmol/L   Potassium 3.5 3.5 - 5.1 mmol/L   Chloride 97 96 - 112 mmol/L   CO2 21 19 - 32 mmol/L   Glucose, Bld 84 70 - 99 mg/dL   BUN 13 6 - 23 mg/dL   Creatinine, Ser 1.15 0.50 - 1.35 mg/dL   Calcium 8.3 (L) 8.4 - 10.5 mg/dL   GFR calc non Af Amer 64 (L) >90 mL/min   GFR calc Af Amer 74 (L) >90 mL/min   Anion gap 6 5 - 15  CBC with Differential  Result Value Ref Range   WBC 15.7 (H) 4.0 - 10.5 K/uL   RBC 4.78 4.22 - 5.81 MIL/uL   Hemoglobin 14.8 13.0 - 17.0 g/dL   HCT 42.8 39.0 - 52.0 %   MCV 89.5 78.0 - 100.0 fL   MCH 31.0 26.0 - 34.0 pg   MCHC 34.6 30.0 - 36.0 g/dL   RDW 13.4 11.5 - 15.5 %   Platelets 292 150 - 400 K/uL   Neutrophils Relative % 86 (H) 43 - 77 %   Neutro Abs 13.5 (H) 1.7 - 7.7 K/uL   Lymphocytes Relative 7 (L) 12 - 46 %   Lymphs Abs 1.1 0.7 - 4.0 K/uL   Monocytes Relative 6 3 - 12 %   Monocytes Absolute 1.0 0.1 - 1.0 K/uL   Eosinophils Relative 1 0 - 5 %   Eosinophils Absolute 0.1 0.0 - 0.7 K/uL   Basophils Relative 0 0 - 1 %   Basophils Absolute 0.0 0.0 - 0.1 K/uL  Urinalysis, Routine w reflex microscopic  Result Value Ref Range   Color, Urine YELLOW YELLOW   APPearance CLEAR CLEAR   Specific Gravity, Urine <1.005 (L) 1.005 - 1.030   pH 6.0 5.0 - 8.0   Glucose, UA NEGATIVE NEGATIVE mg/dL   Hgb urine dipstick TRACE (A) NEGATIVE   Bilirubin Urine NEGATIVE NEGATIVE   Ketones, ur NEGATIVE NEGATIVE mg/dL   Protein, ur NEGATIVE NEGATIVE mg/dL   Urobilinogen, UA 0.2 0.0 - 1.0 mg/dL   Nitrite NEGATIVE NEGATIVE   Leukocytes, UA TRACE (A) NEGATIVE  Urine microscopic-add on   Result Value Ref Range   Squamous Epithelial / LPF RARE RARE   WBC, UA 3-6 <3 WBC/hpf   RBC / HPF 0-2 <3 RBC/hpf   Bacteria, UA RARE RARE    MDM   Final diagnoses:  Hyponatremia  Dysuria  Pelvic pain in male    Urinary symptoms which may represent recurrence of urinary tract infection. Old records are reviewed confirming recent hospitalization. Blood culture  had shown Klebsiella which is sensitive to fluoroquinolones but urinalysis on the hospital actually did not show evidence of UTI. He had a CT scan about one year ago which showed no evidence of nephrolithiasis. He is status post right nephrectomy.  Urinalysis is come back negative for urinary tract infection. Bladder scan does show that he is not emptying his bladder completely because there is 310 mL residual. He had no relief of pain with oxycodone-acetaminophen. WBC is elevated at 15.7, but this is actually decreased from what it had been when he was in the hospital. However, sodium is come back at 124 which is down from 135 at hospital discharge. It is not clear what the cause of his pain is. I am also concerned about the rapidity that he has redeveloped hyponatremia without being on diuretics. He is given IV hydration and is given hydromorphone for pain. Case is discussed with Dr. Maudie Mercury of [facility] who agrees to admit the patient.  Delora Fuel, MD 24/49/75 [id number]

## 2014-05-20 NOTE — Progress Notes (Addendum)
Shift Event: Pt with pinkish output and few clots in foley catheter placed this morning, and pt requesting to see MD.  BP 141/82, pt symptomatic. Likely trauma from foley. Hemoglobin stable at 13.4. Will continue to monitor  [facility]

## 2014-05-20 NOTE — Progress Notes (Signed)
Mid-Level paged second time per patient request to evaluate urinary characteristics as he reports his urine was not pink with blood clots on admission. Will continue to monitor. Reassurance given to patient. Patient calm and accepting of plan of care.

## 2014-05-20 NOTE — ED Notes (Signed)
Dx with UTI while inpatient here 2/18-2/22 for chest pain, did not complete levaquin rx. Tonight returned symptoms of urinary frequency

## 2014-05-20 NOTE — ED Notes (Signed)
Family at bedside. Patient states that he is only having pain when he goes to urinate. Patient's BP is elevated at this time.

## 2014-05-21 LAB — CBC
HCT: 43.2 % (ref 39.0–52.0)
HEMOGLOBIN: 14.8 g/dL (ref 13.0–17.0)
MCH: 31.1 pg (ref 26.0–34.0)
MCHC: 34.3 g/dL (ref 30.0–36.0)
MCV: 90.8 fL (ref 78.0–100.0)
PLATELETS: 341 10*3/uL (ref 150–400)
RBC: 4.76 MIL/uL (ref 4.22–5.81)
RDW: 13.8 % (ref 11.5–15.5)
WBC: 13.2 10*3/uL — ABNORMAL HIGH (ref 4.0–10.5)

## 2014-05-21 LAB — BASIC METABOLIC PANEL
Anion gap: 6 (ref 5–15)
BUN: 10 mg/dL (ref 6–23)
CO2: 26 mmol/L (ref 19–32)
Calcium: 8.7 mg/dL (ref 8.4–10.5)
Chloride: 106 mmol/L (ref 96–112)
Creatinine, Ser: 1.06 mg/dL (ref 0.50–1.35)
GFR calc Af Amer: 81 mL/min — ABNORMAL LOW (ref >90)
GFR calc non Af Amer: 70 mL/min — ABNORMAL LOW (ref >90)
Glucose, Bld: 90 mg/dL (ref 70–99)
Potassium: 4 mmol/L (ref 3.5–5.1)
SODIUM: 138 mmol/L (ref 135–145)

## 2014-05-21 MED ORDER — LEVOFLOXACIN 500 MG PO TABS: 500.0000 mg | ORAL_TABLET | Freq: Every day | ORAL | Status: AC

## 2014-05-21 MED ORDER — TAMSULOSIN HCL 0.4 MG PO CAPS: 0.4000 mg | ORAL_CAPSULE | Freq: Every day | ORAL | Status: AC

## 2014-05-21 NOTE — Clinical Documentation Improvement (Signed)
  69 year old black male admitted with urinary retention and partially treated UTI.  BUN/Creat this admission posted below.  Indwelling Foley catheter inserted after admission 2/2 complaints of suprapubic pain and 700 mls returned per nursing note.   Creatinine has improved by .09 mg/dL in 24 hours.  Component     Latest Ref Rng 05/20/2014 05/21/2014  BUN     6 - 23 mg/dL 13 10  Creatinine     0.50 - 1.35 mg/dL 1.15 1.06  GFR calc Af Amer     >90 mL/min 74 (L) 81 (L)   Possible Clinical Conditions:  - AKI 2/2 urinary retention  - Other Condition  - Unable to Clinically Determine  Thank You, Erling Conte ,RN Clinical Documentation Specialist:  [phone] [facility]

## 2014-05-21 NOTE — Plan of Care (Signed)
Problem: Phase II Progression Outcomes Goal: Progress activity as tolerated unless otherwise ordered Outcome: Completed/Met Date Met:  05/21/14 Patient is ambulating in the hall.

## 2014-05-21 NOTE — Progress Notes (Signed)
Discussed with patient the leg bag vs the standard drainage bag.  Patient stated that he prefered the standard bag.  So the leg bag changed back.  Demonstrated for the patient how to empty the drainage bag.  He understood AEB return demonstration of emptying the bag. Patient discharged with instructions, prescription, and care notes.  Verbalized understanding via teach back.  IV was removed and the site was WNL. Patient voiced no further complaints or concerns at the time of discharge.  Appointments scheduled per instructions.  Patient left the floor via ambulation with staff and family in stable condition.

## 2014-05-21 NOTE — Plan of Care (Signed)
Problem: Phase II Progression Outcomes Goal: Discharge plan established Outcome: Progressing Plan to discharge today.

## 2014-05-21 NOTE — Progress Notes (Signed)
UR chart review completed.  

## 2014-05-21 NOTE — Discharge Summary (Signed)
Physician Discharge Summary  Bobby Sosa DPO:[id number] DOB: 1945/07/06 DOA: 05/20/2014  PCP: Purvis Kilts, MD  Admit date: 05/20/2014 Discharge date: 05/21/2014  Time spent: 45 minutes  Recommendations for Outpatient Follow-up:  -Will be discharged home today. -Foley catheter will be left in place and will arrange follow up with GU later this week.   Discharge Diagnoses:  Principal Problem:   Urinary retention Active Problems:   Hyponatremia   UTI (lower urinary tract infection)   Bladder spasms   Discharge Condition: Stable and improved  Filed Weights   05/20/14 0824  Weight: 99.79 kg (220 lb)    History of present illness:  Patient is a 69 year old man with past medical history significant for hypertension. He was recently in the hospital and discharged on February 22 after being treated for hyponatremia, chest pain and a UTI. He only took his Levaquin for 2 days and then he felt better so he stopped taking it. During that hospitalization he also had some hiccups and was given a prescription for Thorazine upon discharge. He presents to the hospital today with increased suprapubic pain and inability to urinate. Bladder scan in the emergency department shows 250 mL in the bladder. Urinalysis does not show evidence for a UTI. His sodium is again low at 124 and he has a white count of 15.7. We have been asked to admit him for further evaluation and management.  Hospital Course:   Acute Urinary Retention -Large amount of urine drained after foley placed. -Suspect related to thorazine. -Have started flomax. -Will DC with foley and have him follow up with GU later this week for voiding trial. -Have instructed he discontinue use of thorazine.  Hyponatremia -Resolved with IVF. -Have discontinued HCTZ.  Bladder Spasms -2/2 acute retention. -Started on flomax.  Partially Treated UTI -Did not complete levaquin prescribed during prior hospitalization. -Complete 5  days on DC.  HTN -Well controlled.  Procedures:  None   Consultations:  None  Discharge Instructions  Discharge Instructions    Continue foley catheter    Complete by:  As directed      Diet - low sodium heart healthy    Complete by:  As directed      Increase activity slowly    Complete by:  As directed             Medication List    STOP taking these medications        baclofen 10 MG tablet  Commonly known as:  LIORESAL     chlorproMAZINE 50 MG tablet  Commonly known as:  THORAZINE     hydrochlorothiazide 25 MG tablet  Commonly known as:  HYDRODIURIL      TAKE these medications        acetaminophen 325 MG tablet  Commonly known as:  TYLENOL  Take 2 tablets (650 mg total) by mouth every 4 (four) hours as needed for fever (greater than 101.5 F).     amLODipine 10 MG tablet  Commonly known as:  NORVASC  Take 5 mg by mouth daily.     ANUCORT-HC 25 MG suppository  Generic drug:  hydrocortisone  Place 25 mg rectally every 6 (six) hours as needed.     aspirin EC 81 MG tablet  Take 81 mg by mouth daily.     carvedilol 6.25 MG tablet  Commonly known as:  COREG  Take 6.25 mg by mouth daily.     levofloxacin 500 MG tablet  Commonly  known as:  LEVAQUIN  Take 1 tablet (500 mg total) by mouth daily.     lisinopril 40 MG tablet  Commonly known as:  PRINIVIL,ZESTRIL  Take 40 mg by mouth daily.     omeprazole 20 MG capsule  Commonly known as:  PRILOSEC  Take 20 mg by mouth daily.     oxyCODONE-acetaminophen 5-325 MG per tablet  Commonly known as:  PERCOCET  Take 1 tablet by mouth every 4 (four) hours as needed for severe pain.     tamsulosin 0.4 MG Caps capsule  Commonly known as:  FLOMAX  Take 1 capsule (0.4 mg total) by mouth daily.       No Known Allergies     Follow-up Information    Follow up with Jorja Loa, MD On 05/25/2014.   Specialty:  Urology   Why:  At the [facility] information:   [place] [place] [address] [phone]        The results of significant diagnostics from this hospitalization (including imaging, microbiology, ancillary and laboratory) are listed below for reference.    Significant Diagnostic Studies: Dg Chest 2 View  05/13/2014   CLINICAL DATA:  Hypertension, followup  EXAM: CHEST  2 VIEW  COMPARISON:  05/11/2014  FINDINGS: Upper normal heart size.  Tortuous aorta.  Mediastinal contours and pulmonary vascularity normal.  Emphysematous and bronchitic changes question COPD.  No acute infiltrate, pleural effusion or pneumothorax.  Curvilinear density at the RIGHT shoulder has been shown by interval shoulder radiographs to be related to a large calcified loose body in the subcoracoid recess.  Degenerative changes LEFT glenohumeral joint.  No acute bone lesions identified.  IMPRESSION: COPD changes.  No acute abnormalities.   Electronically Signed   By: Lavonia Dana M.D.   On: 05/13/2014 15:28   Dg Chest 2 View  05/11/2014   CLINICAL DATA:  Shortness of breath, chest pain with frequent hiccups, incontinence since noon, hypertension  EXAM: CHEST  2 VIEW  COMPARISON:  11/10/2010  FINDINGS: Enlargement of cardiac silhouette, tortuous aorta.  Mediastinal contours and pulmonary vascularity otherwise normal.  Minimal RIGHT basilar atelectasis.  Lungs otherwise clear.  No pleural effusion or pneumothorax.  LEFT glenohumeral degenerative changes.  4.0 x 3.9 cm diameter lucency with thin sclerotic rim projects over the LEFT scapula, new since 2012.  IMPRESSION: Enlargement of cardiac silhouette with minimal RIGHT basilar atelectasis.  4 cm lucency with thin sclerotic rim projects over the LEFT scapula; this could represent a lytic process of the LEFT scapula or a faintly calcified loose body at the subcoracoid recess of the LEFT shoulder joint.  Dedicated LEFT shoulder radiographs recommended to assess.   Electronically Signed   By: Lavonia Dana M.D.   On: 05/11/2014 20:32   Ct  Abdomen Pelvis W Contrast  05/11/2014   CLINICAL DATA:  Generalized abdominal pain, history of renal cell carcinoma  EXAM: CT ABDOMEN AND PELVIS WITH CONTRAST  TECHNIQUE: Multidetector CT imaging of the abdomen and pelvis was performed using the standard protocol following bolus administration of intravenous contrast.  CONTRAST:  116mL OMNIPAQUE IOHEXOL 300 MG/ML SOLN, 11mL OMNIPAQUE IOHEXOL 300 MG/ML SOLN  COMPARISON:  12/17/2010  FINDINGS: There is trace left pleural effusion with left base posterior atelectasis. Sagittal images of the spine shows degenerative changes thoracolumbar spine. Significant disc space flattening at L5-S1 level. Mild posterior disc bulge at L4-L5 level. Facet degenerative changes at L4 level.  There is mild hepatic  fatty infiltration. Gallbladder is contracted. No calcified gallstones are noted within gallbladder. The pancreas spleen and adrenal glands are unremarkable. The patient is status post right nephrectomy. There is normal enhancement of the left kidney. Delayed renal images shows normal excretion of the left kidney. There is no left hydronephrosis or hydroureter. Visualized proximal left ureter is unremarkable.  No small bowel obstruction. No ascites or free air. There is no pericecal inflammation. Normal appendix is partially visualized.  There is significant and large prostate gland with indentation of urinary bladder base. Prostate gland measures at least 8.3 x 8 cm. Correlation with urology exam is recommended. There is thickening of urinary bladder wall. This may be due to chronic inflammation or cystitis. Clinical correlation is necessary.  No pelvic ascites or adenopathy. No inguinal adenopathy. No destructive bony lesions are noted within pelvis. Degenerative changes bilateral SI joints.  IMPRESSION: 1. There is small left pleural effusion with left base posterior atelectasis. 2. Mild hepatic fatty infiltration. 3. Degenerative changes lumbar spine. 4. Status post right  nephrectomy. No left hydronephrosis or hydroureter. 5. No pericecal inflammation. 6. Significant enlarged prostate gland with indentation of urinary bladder base. Follow-up urology exam is recommended. 7. There is mild thickening of urinary bladder wall. Chronic inflammation or cystitis cannot be excluded. Clinical correlation is necessary.   Electronically Signed   By: [place] Crocker M.D.   On: 05/11/2014 22:42   Nm Pulmonary Perf And Vent  05/13/2014   CLINICAL DATA:  Shortness of breath, hiccups, elevated D-dimer, hypertension  EXAM: NUCLEAR MEDICINE VENTILATION - PERFUSION LUNG SCAN  TECHNIQUE: Ventilation images were obtained in multiple projections using inhaled aerosol technetium 99 M DTPA. Perfusion images were obtained in multiple projections after intravenous injection of Tc-66m MAA.  RADIOPHARMACEUTICALS:  40 mCi Tc-62m DTPA aerosol and 6 mCi Tc-36m MAA  COMPARISON:  None  Correlation:  Chest radiographs 05/13/2014  FINDINGS: Ventilation: Central airway deposition of tracer. No definite focal ventilatory abnormalities.  Perfusion: Normal  Chest radiograph demonstrates upper normal size of cardiac silhouette and aortic tortuosity. Emphysematous and bronchitic changes suggesting COPD. No infiltrates.  IMPRESSION: Normal perfusion lung scan.   Electronically Signed   By: Lavonia Dana M.D.   On: 05/13/2014 15:08   Dg Shoulder Left  05/11/2014   CLINICAL DATA:  Left shoulder films recommended by radiologist following abnormality shown on cxr earlier tonight. Pt denies shoulder pain or injury, stated prior history of bone spurs in both shoulders.  EXAM: LEFT SHOULDER - 2+ VIEW  COMPARISON:  None.  FINDINGS: Bony abnormality seen on the current chest radiograph projects below the coracoid process on the current exam. This may reflect a large intra-articular body within the sub scapular recess or within the subcoracoid recess.  There are no osteoblastic or osteolytic lesions. There are no fractures.  There are  advanced arthropathic changes of the glenohumeral joint with marked joint space narrowing, marginal osteophytes most prominently from the margins of the humeral head, mild subchondral sclerosis.  The [facility] joint is well maintained.  Soft tissues are otherwise unremarkable.  IMPRESSION: Abnormality noted on the current chest radiograph appears to reside anterior to the scapula and below the coracoid process. This may reflect intra-articular body. This is supported by advanced arthropathic changes of the glenohumeral joint. There are no findings suspicious for neoplastic disease to bone. No fracture or acute finding.   Electronically Signed   By: Lajean Manes M.D.   On: 05/11/2014 22:55    Microbiology: Recent Results (from the past  240 hour(s))  Culture, blood (routine x 2)     Status: None   Collection Time: 05/12/14 12:33 AM  Result Value Ref Range Status   Specimen Description BLOOD RIGHT HAND  Final   Special Requests BOTTLES DRAWN AEROBIC ONLY 5CC  Final   Culture NO GROWTH 5 DAYS  Final   Report Status 05/17/2014 FINAL  Final  Culture, blood (routine x 2)     Status: None   Collection Time: 05/12/14 12:36 AM  Result Value Ref Range Status   Specimen Description BLOOD RIGHT HAND  Final   Special Requests BOTTLES DRAWN AEROBIC AND ANAEROBIC 6CC  Final   Culture   Final    KLEBSIELLA PNEUMONIAE Note: Gram Stain Report Called to,Read Back By and Verified With: DR [id number] 05/15/14 BY Tyrone Schimke M Performed at [facility] Performed at [facility]    Report Status 05/17/2014 FINAL  Final   Organism ID, Bacteria KLEBSIELLA PNEUMONIAE  Final      Susceptibility   Klebsiella pneumoniae - MIC*    AMPICILLIN >=32 RESISTANT Resistant     AMPICILLIN/SULBACTAM 4 SENSITIVE Sensitive     CEFAZOLIN <=4 SENSITIVE Sensitive     CEFEPIME <=1 SENSITIVE Sensitive     CEFTAZIDIME <=1 SENSITIVE Sensitive     CEFTRIAXONE <=1 SENSITIVE Sensitive     CIPROFLOXACIN <=0.25 SENSITIVE Sensitive       GENTAMICIN <=1 SENSITIVE Sensitive     IMIPENEM <=0.25 SENSITIVE Sensitive     PIP/TAZO <=4 SENSITIVE Sensitive     TOBRAMYCIN <=1 SENSITIVE Sensitive     TRIMETH/SULFA <=20 SENSITIVE Sensitive     * KLEBSIELLA PNEUMONIAE  Culture, blood (routine x 2)     Status: None   Collection Time: 05/13/14  6:44 AM  Result Value Ref Range Status   Specimen Description BLOOD RIGHT ANTECUBITAL  Final   Special Requests BOTTLES DRAWN AEROBIC AND ANAEROBIC 6CC  Final   Culture NO GROWTH 5 DAYS  Final   Report Status 05/18/2014 FINAL  Final  Culture, blood (routine x 2)     Status: None   Collection Time: 05/13/14  6:44 AM  Result Value Ref Range Status   Specimen Description BLOOD RIGHT HAND  Final   Special Requests BOTTLES DRAWN AEROBIC AND ANAEROBIC 6CC  Final   Culture NO GROWTH 5 DAYS  Final   Report Status 05/18/2014 FINAL  Final     Labs: Basic Metabolic Panel:  Recent Labs Lab 05/20/14 0434 05/21/14 0614  NA 124* 138  K 3.5 4.0  CL 97 106  CO2 21 26  GLUCOSE 84 90  BUN 13 10  CREATININE 1.15 1.06  CALCIUM 8.3* 8.7   Liver Function Tests: No results for input(s): AST, ALT, ALKPHOS, BILITOT, PROT, ALBUMIN in the last 168 hours. No results for input(s): LIPASE, AMYLASE in the last 168 hours. No results for input(s): AMMONIA in the last 168 hours. CBC:  Recent Labs Lab 05/20/14 0434 05/20/14 2211 05/21/14 0614  WBC 15.7*  --  13.2*  NEUTROABS 13.5*  --   --   HGB 14.8 13.4 14.8  HCT 42.8 38.9* 43.2  MCV 89.5  --  90.8  PLT 292  --  341   Cardiac Enzymes: No results for input(s): CKTOTAL, CKMB, CKMBINDEX, TROPONINI in the last 168 hours. BNP: BNP (last 3 results) No results for input(s): BNP in the last 8760 hours.  ProBNP (last 3 results) No results for input(s): PROBNP in the last 8760 hours.  CBG:  No results for input(s): GLUCAP in the last 168 hours.     SignedLelon Frohlich  [facility] Pager: [phone] 05/21/2014, 2:52  PM

## 2014-05-21 NOTE — Care Management Note (Signed)
    Page 1 of 1   05/21/2014     12:58:52 PM CARE MANAGEMENT NOTE 05/21/2014  Patient:  OMARR, HANN   Account Number:  [id number]  Date Initiated:  05/21/2014  Documentation initiated by:  Theophilus Kinds  Subjective/Objective Assessment:   Pt admitted from home with urinary retention. Pt lives alone and has a son and sister who are very active in the care of the pt. Pt is fairly independent with ADL's. Pt has a cane, walker, and w/c for home use.     Action/Plan:   No Cm needs noted. Pt has followup appt with urology already made and documented on AVS. Staff to teach pt how to empty foley catheter bag.   Anticipated DC Date:  05/21/2014   Anticipated DC Plan:  [place]  CM consult      Choice offered to / List presented to:             Status of service:  Completed, signed off Medicare Important Message given?  NA - LOS <3 / Initial given by admissions (If response is "NO", the following Medicare IM given date fields will be blank) Date Medicare IM given:   Medicare IM given by:   Date Additional Medicare IM given:   Additional Medicare IM given by:    Discharge Disposition:  HOME/SELF CARE  Per UR Regulation:    If discussed at Long Length of Stay Meetings, dates discussed:    Comments:  05/21/14 [place], RN BSN CM

## 2014-05-22 ENCOUNTER — Ambulatory Visit (INDEPENDENT_AMBULATORY_CARE_PROVIDER_SITE_OTHER): Payer: Medicare Other | Admitting: Urology

## 2014-05-22 DIAGNOSIS — R339 Retention of urine, unspecified: Secondary | ICD-10-CM | POA: Diagnosis not present

## 2014-05-22 DIAGNOSIS — N401 Enlarged prostate with lower urinary tract symptoms: Secondary | ICD-10-CM | POA: Diagnosis not present

## 2014-05-22 LAB — URINE CULTURE
Colony Count: NO GROWTH
Culture: NO GROWTH

## 2014-06-12 ENCOUNTER — Ambulatory Visit (INDEPENDENT_AMBULATORY_CARE_PROVIDER_SITE_OTHER): Payer: Medicare Other | Admitting: Urology

## 2014-06-12 DIAGNOSIS — N401 Enlarged prostate with lower urinary tract symptoms: Secondary | ICD-10-CM

## 2014-06-12 DIAGNOSIS — R339 Retention of urine, unspecified: Secondary | ICD-10-CM

## 2014-06-12 DIAGNOSIS — C649 Malignant neoplasm of unspecified kidney, except renal pelvis: Secondary | ICD-10-CM | POA: Diagnosis not present

## 2014-06-12 DIAGNOSIS — R972 Elevated prostate specific antigen [PSA]: Secondary | ICD-10-CM | POA: Diagnosis not present

## 2014-10-09 ENCOUNTER — Ambulatory Visit: Payer: Medicare Other | Admitting: Urology

## 2014-10-18 DIAGNOSIS — L508 Other urticaria: Secondary | ICD-10-CM | POA: Diagnosis not present

## 2014-10-18 DIAGNOSIS — Z6830 Body mass index (BMI) 30.0-30.9, adult: Secondary | ICD-10-CM | POA: Diagnosis not present

## 2014-10-18 DIAGNOSIS — E6609 Other obesity due to excess calories: Secondary | ICD-10-CM | POA: Diagnosis not present

## 2014-10-18 DIAGNOSIS — Z1389 Encounter for screening for other disorder: Secondary | ICD-10-CM | POA: Diagnosis not present

## 2015-01-10 NOTE — Patient Outreach (Signed)
[facility]) Care Management  01/10/2015  Bobby Sosa [birth year] [id number]   Referral from [employer] Tier 2 List, assigned Jon Billings, RN to outreach.  Thanks, Ronnell Freshwater. [place], [place] Assistant Phone: [phone] Fax: [phone]

## 2015-01-11 ENCOUNTER — Other Ambulatory Visit: Payer: Self-pay

## 2015-01-11 NOTE — Patient Outreach (Signed)
[facility]) Care Management  01/11/2015  Bobby Sosa [birth year] [id number]  Return phone call from patient for Tier 2 referral.  Explained [facility].  Patient denies HF, CHF, DM, or HTN.  Patient declined Care Management Services at this time. However, patient is receptive to receiving letter and brochure.    Plan: RN Health Coach will send patient letter and brochure. RN Health Coach will send letter to primary doctor. RN Health Coach will send in basket to [place] for case closure.  Jone Baseman, RN, MSN [place] [phone]

## 2015-01-11 NOTE — Patient Outreach (Addendum)
[facility]) Care Management  01/11/2015  Bobby Sosa 1945-05-31 [id number]   First telephone call to patient regarding Tier 2 referral.  No answer.  HIPAA compliant voice message left.   Plan: RN Health Coach will attempt telephone outreach within 1-2 weeks.    Bobby Baseman, RN, MSN [place] [phone]

## 2015-01-17 NOTE — Patient Outreach (Signed)
[facility]) Care Management  01/17/2015  [name] 10/30/1945 [id number]   Notification from Jon Billings, RN to close case due to patient refused [facility] Management services.  Thanks, Ronnell Freshwater. [place], [place] Assistant Phone: [phone] Fax: [phone]

## 2015-03-12 DIAGNOSIS — E6609 Other obesity due to excess calories: Secondary | ICD-10-CM | POA: Diagnosis not present

## 2015-03-12 DIAGNOSIS — Z6830 Body mass index (BMI) 30.0-30.9, adult: Secondary | ICD-10-CM | POA: Diagnosis not present

## 2015-03-12 DIAGNOSIS — Z1389 Encounter for screening for other disorder: Secondary | ICD-10-CM | POA: Diagnosis not present

## 2015-03-12 DIAGNOSIS — J069 Acute upper respiratory infection, unspecified: Secondary | ICD-10-CM | POA: Diagnosis not present

## 2015-03-12 DIAGNOSIS — J0100 Acute maxillary sinusitis, unspecified: Secondary | ICD-10-CM | POA: Diagnosis not present

## 2015-07-19 DIAGNOSIS — Z1389 Encounter for screening for other disorder: Secondary | ICD-10-CM | POA: Diagnosis not present

## 2015-07-19 DIAGNOSIS — K219 Gastro-esophageal reflux disease without esophagitis: Secondary | ICD-10-CM | POA: Diagnosis not present

## 2015-07-19 DIAGNOSIS — E6609 Other obesity due to excess calories: Secondary | ICD-10-CM | POA: Diagnosis not present

## 2015-07-19 DIAGNOSIS — Z6830 Body mass index (BMI) 30.0-30.9, adult: Secondary | ICD-10-CM | POA: Diagnosis not present

## 2015-07-19 DIAGNOSIS — T07 Unspecified multiple injuries: Secondary | ICD-10-CM | POA: Diagnosis not present

## 2015-07-19 DIAGNOSIS — M1991 Primary osteoarthritis, unspecified site: Secondary | ICD-10-CM | POA: Diagnosis not present

## 2015-10-01 ENCOUNTER — Ambulatory Visit: Payer: Medicare Other | Admitting: Urology

## 2016-03-17 DIAGNOSIS — Z1389 Encounter for screening for other disorder: Secondary | ICD-10-CM | POA: Diagnosis not present

## 2016-03-17 DIAGNOSIS — E6609 Other obesity due to excess calories: Secondary | ICD-10-CM | POA: Diagnosis not present

## 2016-03-17 DIAGNOSIS — Z6831 Body mass index (BMI) 31.0-31.9, adult: Secondary | ICD-10-CM | POA: Diagnosis not present

## 2016-03-17 DIAGNOSIS — L0211 Cutaneous abscess of neck: Secondary | ICD-10-CM | POA: Diagnosis not present

## 2016-05-26 DIAGNOSIS — Z0000 Encounter for general adult medical examination without abnormal findings: Secondary | ICD-10-CM | POA: Diagnosis not present

## 2016-05-26 DIAGNOSIS — I10 Essential (primary) hypertension: Secondary | ICD-10-CM | POA: Diagnosis not present

## 2016-05-26 DIAGNOSIS — Z6830 Body mass index (BMI) 30.0-30.9, adult: Secondary | ICD-10-CM | POA: Diagnosis not present

## 2016-07-09 DIAGNOSIS — Z6830 Body mass index (BMI) 30.0-30.9, adult: Secondary | ICD-10-CM | POA: Diagnosis not present

## 2016-07-09 DIAGNOSIS — R590 Localized enlarged lymph nodes: Secondary | ICD-10-CM | POA: Diagnosis not present

## 2016-07-09 DIAGNOSIS — E6609 Other obesity due to excess calories: Secondary | ICD-10-CM | POA: Diagnosis not present

## 2016-07-09 DIAGNOSIS — K098 Other cysts of oral region, not elsewhere classified: Secondary | ICD-10-CM | POA: Diagnosis not present

## 2016-08-12 DIAGNOSIS — Z6829 Body mass index (BMI) 29.0-29.9, adult: Secondary | ICD-10-CM | POA: Diagnosis not present

## 2016-08-12 DIAGNOSIS — C641 Malignant neoplasm of right kidney, except renal pelvis: Secondary | ICD-10-CM | POA: Diagnosis not present

## 2017-04-30 ENCOUNTER — Other Ambulatory Visit: Payer: Self-pay | Admitting: Podiatry

## 2017-04-30 ENCOUNTER — Ambulatory Visit (INDEPENDENT_AMBULATORY_CARE_PROVIDER_SITE_OTHER): Payer: Medicare Other

## 2017-04-30 ENCOUNTER — Ambulatory Visit (INDEPENDENT_AMBULATORY_CARE_PROVIDER_SITE_OTHER): Payer: Medicare Other | Admitting: Podiatry

## 2017-04-30 ENCOUNTER — Encounter: Payer: Self-pay | Admitting: Podiatry

## 2017-04-30 VITALS — BP 147/88 | HR 59 | Resp 16

## 2017-04-30 DIAGNOSIS — M779 Enthesopathy, unspecified: Secondary | ICD-10-CM

## 2017-04-30 DIAGNOSIS — M722 Plantar fascial fibromatosis: Secondary | ICD-10-CM

## 2017-04-30 DIAGNOSIS — M25571 Pain in right ankle and joints of right foot: Secondary | ICD-10-CM

## 2017-04-30 NOTE — Progress Notes (Signed)
Subjective:   Patient ID: Bobby Sosa, male   DOB: 72 y.o.   MRN: [id number]   HPI Patient presents stating he is on his feet for a long time he gets pain in his feet and he feels like he needs support that he has had in the past.  Patient does not smoke and likes to be active   Review of Systems  All other systems reviewed and are negative.       Objective:  Physical Exam  Constitutional: He appears well-developed and well-nourished.  Cardiovascular: Intact distal pulses.  Pulmonary/Chest: Effort normal.  Musculoskeletal: Normal range of motion.  Neurological: He is alert.  Skin: Skin is warm.  Nursing note and vitals reviewed.   Neurovascular status was found to be intact muscle strength was adequate with moderate depression of the arch bilateral and scars on the right ankle secondary to ankle surgery approximately 5 years ago.  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     Plan:  Patient will have these made at the [place] indicate that there is moderate depression of the arch and there is multiple indications of fixation in the right ankle with both plates and screws

## 2017-05-21 ENCOUNTER — Telehealth: Payer: Self-pay | Admitting: Podiatry

## 2017-05-21 NOTE — Telephone Encounter (Signed)
I will get this taken care of. Thank you.

## 2017-05-21 NOTE — Telephone Encounter (Signed)
[facility] would like notes from NP visit faxed to [phone], Attn: Larene [place].

## 2017-05-27 DIAGNOSIS — E6609 Other obesity due to excess calories: Secondary | ICD-10-CM | POA: Diagnosis not present

## 2017-05-27 DIAGNOSIS — Z1389 Encounter for screening for other disorder: Secondary | ICD-10-CM | POA: Diagnosis not present

## 2017-05-27 DIAGNOSIS — Z0001 Encounter for general adult medical examination with abnormal findings: Secondary | ICD-10-CM | POA: Diagnosis not present

## 2017-05-27 DIAGNOSIS — M179 Osteoarthritis of knee, unspecified: Secondary | ICD-10-CM | POA: Diagnosis not present

## 2017-05-27 DIAGNOSIS — I10 Essential (primary) hypertension: Secondary | ICD-10-CM | POA: Diagnosis not present

## 2017-05-27 DIAGNOSIS — Z6831 Body mass index (BMI) 31.0-31.9, adult: Secondary | ICD-10-CM | POA: Diagnosis not present

## 2017-05-27 DIAGNOSIS — N529 Male erectile dysfunction, unspecified: Secondary | ICD-10-CM | POA: Diagnosis not present

## 2017-05-27 DIAGNOSIS — L740 Miliaria rubra: Secondary | ICD-10-CM | POA: Diagnosis not present

## 2018-10-20 ENCOUNTER — Other Ambulatory Visit: Payer: Self-pay

## 2019-02-02 DIAGNOSIS — E7849 Other hyperlipidemia: Secondary | ICD-10-CM | POA: Diagnosis not present

## 2019-02-02 DIAGNOSIS — Z0001 Encounter for general adult medical examination with abnormal findings: Secondary | ICD-10-CM | POA: Diagnosis not present

## 2019-02-02 DIAGNOSIS — Z1389 Encounter for screening for other disorder: Secondary | ICD-10-CM | POA: Diagnosis not present

## 2019-02-02 DIAGNOSIS — I10 Essential (primary) hypertension: Secondary | ICD-10-CM | POA: Diagnosis not present

## 2019-02-02 DIAGNOSIS — Z6829 Body mass index (BMI) 29.0-29.9, adult: Secondary | ICD-10-CM | POA: Diagnosis not present

## 2019-02-02 DIAGNOSIS — E663 Overweight: Secondary | ICD-10-CM | POA: Diagnosis not present

## 2019-02-02 DIAGNOSIS — Z23 Encounter for immunization: Secondary | ICD-10-CM | POA: Diagnosis not present
